# Patient Record
Sex: Female | Born: 1935 | Race: White | Hispanic: No | Smoking: Never smoker
Health system: Southern US, Community
[De-identification: ages and names within clinical notes are randomized; demographics above are authoritative.]

## PROBLEM LIST (undated history)

## (undated) DIAGNOSIS — K769 Liver disease, unspecified: Secondary | ICD-10-CM

## (undated) DIAGNOSIS — E785 Hyperlipidemia, unspecified: Secondary | ICD-10-CM

## (undated) DIAGNOSIS — C50919 Malignant neoplasm of unspecified site of unspecified female breast: Secondary | ICD-10-CM

## (undated) DIAGNOSIS — F419 Anxiety disorder, unspecified: Secondary | ICD-10-CM

## (undated) DIAGNOSIS — IMO0002 Reserved for concepts with insufficient information to code with codable children: Secondary | ICD-10-CM

## (undated) DIAGNOSIS — H04123 Dry eye syndrome of bilateral lacrimal glands: Secondary | ICD-10-CM

## (undated) DIAGNOSIS — F0390 Unspecified dementia without behavioral disturbance: Secondary | ICD-10-CM

## (undated) DIAGNOSIS — K648 Other hemorrhoids: Secondary | ICD-10-CM

## (undated) DIAGNOSIS — I10 Essential (primary) hypertension: Secondary | ICD-10-CM

## (undated) HISTORY — DX: Anxiety disorder, unspecified: F41.9

## (undated) HISTORY — DX: Reserved for concepts with insufficient information to code with codable children: IMO0002

## (undated) HISTORY — DX: Essential (primary) hypertension: I10

## (undated) HISTORY — PX: ABDOMINAL HYSTERECTOMY: SHX81

## (undated) HISTORY — DX: Liver disease, unspecified: K76.9

## (undated) HISTORY — DX: Dry eye syndrome of bilateral lacrimal glands: H04.123

## (undated) HISTORY — PX: MASTECTOMY: SHX3

## (undated) HISTORY — DX: Other hemorrhoids: K64.8

## (undated) HISTORY — PX: CHOLECYSTECTOMY: SHX55

## (undated) HISTORY — DX: Malignant neoplasm of unspecified site of unspecified female breast: C50.919

## (undated) HISTORY — DX: Hyperlipidemia, unspecified: E78.5

---

## 1992-07-25 DIAGNOSIS — C50919 Malignant neoplasm of unspecified site of unspecified female breast: Secondary | ICD-10-CM

## 1992-07-25 HISTORY — DX: Malignant neoplasm of unspecified site of unspecified female breast: C50.919

## 1998-12-03 ENCOUNTER — Other Ambulatory Visit: Admission: RE | Admit: 1998-12-03 | Discharge: 1998-12-03 | Payer: Self-pay | Admitting: Obstetrics and Gynecology

## 2000-10-04 ENCOUNTER — Other Ambulatory Visit: Admission: RE | Admit: 2000-10-04 | Discharge: 2000-10-04 | Payer: Self-pay | Admitting: Obstetrics and Gynecology

## 2004-10-22 ENCOUNTER — Ambulatory Visit: Payer: Self-pay | Admitting: Internal Medicine

## 2004-11-09 ENCOUNTER — Ambulatory Visit: Payer: Self-pay

## 2005-01-28 ENCOUNTER — Ambulatory Visit: Payer: Self-pay | Admitting: Oncology

## 2005-04-26 ENCOUNTER — Ambulatory Visit: Payer: Self-pay | Admitting: Internal Medicine

## 2005-05-13 ENCOUNTER — Ambulatory Visit: Payer: Self-pay | Admitting: Internal Medicine

## 2005-05-27 ENCOUNTER — Ambulatory Visit: Payer: Self-pay | Admitting: Internal Medicine

## 2005-08-15 ENCOUNTER — Ambulatory Visit: Payer: Self-pay | Admitting: Internal Medicine

## 2005-09-13 ENCOUNTER — Ambulatory Visit: Payer: Self-pay | Admitting: Internal Medicine

## 2005-09-30 ENCOUNTER — Ambulatory Visit: Payer: Self-pay | Admitting: Internal Medicine

## 2005-09-30 ENCOUNTER — Ambulatory Visit (HOSPITAL_COMMUNITY): Admission: RE | Admit: 2005-09-30 | Discharge: 2005-09-30 | Payer: Self-pay | Admitting: Internal Medicine

## 2006-01-30 ENCOUNTER — Ambulatory Visit: Payer: Self-pay | Admitting: Oncology

## 2006-02-09 ENCOUNTER — Ambulatory Visit: Payer: Self-pay | Admitting: Internal Medicine

## 2006-02-13 ENCOUNTER — Ambulatory Visit: Payer: Self-pay | Admitting: Internal Medicine

## 2006-03-23 ENCOUNTER — Ambulatory Visit: Payer: Self-pay | Admitting: Internal Medicine

## 2006-04-20 ENCOUNTER — Ambulatory Visit: Payer: Self-pay | Admitting: Internal Medicine

## 2006-05-04 ENCOUNTER — Ambulatory Visit: Payer: Self-pay | Admitting: Internal Medicine

## 2006-08-16 ENCOUNTER — Ambulatory Visit: Payer: Self-pay | Admitting: Internal Medicine

## 2006-08-16 LAB — CONVERTED CEMR LAB
Bilirubin Urine: NEGATIVE
Crystals: NEGATIVE
Ketones, ur: NEGATIVE mg/dL
Specific Gravity, Urine: >=1.03 (ref 1.000–1.03)
Urine Glucose: NEGATIVE mg/dL
Urobilinogen, UA: 0.2 (ref 0.0–1.0)
pH: 5.5 (ref 5.0–8.0)

## 2006-08-18 ENCOUNTER — Ambulatory Visit (HOSPITAL_COMMUNITY): Admission: RE | Admit: 2006-08-18 | Discharge: 2006-08-18 | Payer: Self-pay | Admitting: Internal Medicine

## 2006-11-14 ENCOUNTER — Ambulatory Visit: Payer: Self-pay

## 2006-11-21 ENCOUNTER — Ambulatory Visit: Payer: Self-pay | Admitting: Internal Medicine

## 2007-02-07 ENCOUNTER — Ambulatory Visit: Payer: Self-pay | Admitting: Oncology

## 2007-02-23 ENCOUNTER — Ambulatory Visit: Payer: Self-pay | Admitting: Internal Medicine

## 2007-02-23 DIAGNOSIS — E785 Hyperlipidemia, unspecified: Secondary | ICD-10-CM | POA: Insufficient documentation

## 2007-02-23 DIAGNOSIS — M810 Age-related osteoporosis without current pathological fracture: Secondary | ICD-10-CM | POA: Insufficient documentation

## 2007-02-23 DIAGNOSIS — I6522 Occlusion and stenosis of left carotid artery: Secondary | ICD-10-CM | POA: Insufficient documentation

## 2007-02-23 DIAGNOSIS — F411 Generalized anxiety disorder: Secondary | ICD-10-CM | POA: Insufficient documentation

## 2007-02-23 DIAGNOSIS — R9431 Abnormal electrocardiogram [ECG] [EKG]: Secondary | ICD-10-CM | POA: Insufficient documentation

## 2007-02-23 DIAGNOSIS — Z853 Personal history of malignant neoplasm of breast: Secondary | ICD-10-CM | POA: Insufficient documentation

## 2007-02-23 DIAGNOSIS — I10 Essential (primary) hypertension: Secondary | ICD-10-CM | POA: Insufficient documentation

## 2007-03-01 ENCOUNTER — Encounter: Payer: Self-pay | Admitting: Internal Medicine

## 2007-03-01 LAB — CONVERTED CEMR LAB
ALT: 23 units/L (ref 0–35)
AST: 21 units/L (ref 0–37)
Albumin: 4 g/dL (ref 3.5–5.2)
Bilirubin, Direct: 0.1 mg/dL (ref 0.0–0.3)
HDL: 66.8 mg/dL (ref >39.0)
Total Bilirubin: 0.8 mg/dL (ref 0.3–1.2)
Total CHOL/HDL Ratio: 2.8
Total Protein: 6.9 g/dL (ref 6.0–8.3)
Triglycerides: 118 mg/dL (ref 0–149)
VLDL: 24 mg/dL (ref 0–40)

## 2007-09-12 ENCOUNTER — Ambulatory Visit: Payer: Self-pay | Admitting: Oncology

## 2007-09-14 ENCOUNTER — Encounter: Payer: Self-pay | Admitting: Internal Medicine

## 2007-09-14 LAB — CBC WITH DIFFERENTIAL/PLATELET
EOS%: 5.4 % (ref 0.0–7.0)
Eosinophils Absolute: 0.3 10*3/uL (ref 0.0–0.5)
HGB: 14.4 g/dL (ref 11.6–15.9)
MCHC: 35.6 g/dL (ref 32.0–36.0)
MONO#: 0.7 10*3/uL (ref 0.1–0.9)
MONO%: 12.2 % (ref 0.0–13.0)
NEUT#: 2.6 10*3/uL (ref 1.5–6.5)
NEUT%: 48.4 % (ref 39.6–76.8)
Platelets: 240 10*3/uL (ref 145–400)
lymph#: 1.8 10*3/uL (ref 0.9–3.3)

## 2007-09-14 LAB — COMPREHENSIVE METABOLIC PANEL
Albumin: 4.3 g/dL (ref 3.5–5.2)
CO2: 27 mEq/L (ref 19–32)
Calcium: 9.8 mg/dL (ref 8.4–10.5)
Chloride: 104 mEq/L (ref 96–112)
Glucose, Bld: 108 mg/dL — ABNORMAL HIGH (ref 70–99)

## 2007-09-17 ENCOUNTER — Ambulatory Visit: Payer: Self-pay | Admitting: Internal Medicine

## 2007-09-17 DIAGNOSIS — L570 Actinic keratosis: Secondary | ICD-10-CM | POA: Insufficient documentation

## 2007-11-12 DIAGNOSIS — J40 Bronchitis, not specified as acute or chronic: Secondary | ICD-10-CM | POA: Insufficient documentation

## 2007-11-12 DIAGNOSIS — Z9109 Other allergy status, other than to drugs and biological substances: Secondary | ICD-10-CM | POA: Insufficient documentation

## 2007-11-12 DIAGNOSIS — I739 Peripheral vascular disease, unspecified: Secondary | ICD-10-CM | POA: Insufficient documentation

## 2007-11-13 ENCOUNTER — Ambulatory Visit: Payer: Self-pay | Admitting: Internal Medicine

## 2007-11-16 ENCOUNTER — Ambulatory Visit: Payer: Self-pay

## 2008-03-12 ENCOUNTER — Ambulatory Visit: Payer: Self-pay | Admitting: Internal Medicine

## 2008-03-12 LAB — CONVERTED CEMR LAB
ALT: 17 units/L (ref 0–35)
Albumin: 4.2 g/dL (ref 3.5–5.2)
BUN: 25 mg/dL — ABNORMAL HIGH (ref 6–23)
Chloride: 103 meq/L (ref 96–112)
Creatinine, Ser: 0.7 mg/dL (ref 0.4–1.2)
GFR calc non Af Amer: 86 mL/min
HDL: 60.8 mg/dL (ref >39.0)
Ketones, ur: NEGATIVE mg/dL
LDL Cholesterol: 96 mg/dL (ref 0–99)
Specific Gravity, Urine: 1.02 (ref 1.000–1.03)
TSH: 1.84 microintl units/mL (ref 0.35–5.50)
Total Protein: 6.9 g/dL (ref 6.0–8.3)
Triglycerides: 93 mg/dL (ref 0–149)
Urine Glucose: NEGATIVE mg/dL
VLDL: 19 mg/dL (ref 0–40)
pH: 6 (ref 5.0–8.0)

## 2008-03-17 ENCOUNTER — Ambulatory Visit: Payer: Self-pay | Admitting: Internal Medicine

## 2008-04-21 ENCOUNTER — Encounter: Payer: Self-pay | Admitting: Internal Medicine

## 2008-09-15 ENCOUNTER — Ambulatory Visit: Payer: Self-pay | Admitting: Internal Medicine

## 2008-09-15 DIAGNOSIS — M79609 Pain in unspecified limb: Secondary | ICD-10-CM | POA: Insufficient documentation

## 2008-09-29 ENCOUNTER — Encounter: Payer: Self-pay | Admitting: Internal Medicine

## 2008-11-18 ENCOUNTER — Ambulatory Visit: Payer: Self-pay

## 2008-11-20 ENCOUNTER — Encounter: Payer: Self-pay | Admitting: Internal Medicine

## 2008-12-19 ENCOUNTER — Ambulatory Visit: Payer: Self-pay | Admitting: Internal Medicine

## 2008-12-31 ENCOUNTER — Telehealth: Payer: Self-pay | Admitting: Internal Medicine

## 2009-03-16 ENCOUNTER — Ambulatory Visit: Payer: Self-pay | Admitting: Internal Medicine

## 2009-09-15 ENCOUNTER — Ambulatory Visit: Payer: Self-pay | Admitting: Internal Medicine

## 2009-09-15 DIAGNOSIS — R6883 Chills (without fever): Secondary | ICD-10-CM | POA: Insufficient documentation

## 2009-09-17 LAB — CONVERTED CEMR LAB
ALT: 20 units/L (ref 0–35)
Alkaline Phosphatase: 48 units/L (ref 39–117)
Basophils Absolute: 0 10*3/uL (ref 0.0–0.1)
Creatinine, Ser: 0.7 mg/dL (ref 0.4–1.2)
GFR calc non Af Amer: 85.09 mL/min (ref >60)
Glucose, Bld: 96 mg/dL (ref 70–99)
HCT: 41.8 % (ref 36.0–46.0)
Ketones, ur: NEGATIVE mg/dL
LDL Cholesterol: 61 mg/dL (ref 0–99)
Leukocytes, UA: NEGATIVE
Lymphocytes Relative: 28.4 % (ref 12.0–46.0)
Lymphs Abs: 1.9 10*3/uL (ref 0.7–4.0)
MCHC: 33.4 g/dL (ref 30.0–36.0)
MCV: 88.9 fL (ref 78.0–100.0)
Nitrite: NEGATIVE
Platelets: 189 10*3/uL (ref 150.0–400.0)
RDW: 12.4 % (ref 11.5–14.6)
Sodium: 142 meq/L (ref 135–145)
Specific Gravity, Urine: 1.02 (ref 1.000–1.030)
Total CHOL/HDL Ratio: 2
Total Protein, Urine: NEGATIVE mg/dL
Total Protein: 6.7 g/dL (ref 6.0–8.3)
Triglycerides: 93 mg/dL (ref 0.0–149.0)
Urine Glucose: NEGATIVE mg/dL
Urobilinogen, UA: 0.2 (ref 0.0–1.0)
VLDL: 18.6 mg/dL (ref 0.0–40.0)
WBC: 6.6 10*3/uL (ref 4.5–10.5)
pH: 5 (ref 5.0–8.0)

## 2009-10-09 ENCOUNTER — Encounter: Payer: Self-pay | Admitting: Internal Medicine

## 2009-11-18 ENCOUNTER — Encounter: Payer: Self-pay | Admitting: Internal Medicine

## 2009-11-19 ENCOUNTER — Ambulatory Visit: Payer: Self-pay

## 2009-11-19 ENCOUNTER — Encounter: Payer: Self-pay | Admitting: Internal Medicine

## 2010-03-17 ENCOUNTER — Ambulatory Visit: Payer: Self-pay | Admitting: Internal Medicine

## 2010-03-17 ENCOUNTER — Encounter: Payer: Self-pay | Admitting: Internal Medicine

## 2010-08-22 LAB — CONVERTED CEMR LAB
AST: 21 units/L (ref 0–37)
Alkaline Phosphatase: 47 units/L (ref 39–117)
BUN: 22 mg/dL (ref 6–23)
Basophils Absolute: 0 10*3/uL (ref 0.0–0.1)
Basophils Absolute: 0 10*3/uL (ref 0.0–0.1)
Basophils Relative: 0.5 % (ref 0.0–3.0)
Basophils Relative: 0.7 % (ref 0.0–3.0)
Bilirubin Urine: NEGATIVE
Bilirubin, Direct: 0.1 mg/dL (ref 0.0–0.3)
Bilirubin, Direct: 0.1 mg/dL (ref 0.0–0.3)
CO2: 31 meq/L (ref 19–32)
Calcium: 9.1 mg/dL (ref 8.4–10.5)
Calcium: 9.2 mg/dL (ref 8.4–10.5)
Chloride: 107 meq/L (ref 96–112)
Cholesterol: 186 mg/dL (ref 0–200)
Creatinine, Ser: 0.6 mg/dL (ref 0.4–1.2)
GFR calc non Af Amer: 101.78 mL/min (ref >60)
GFR calc non Af Amer: 94.24 mL/min (ref >60)
Glucose, Bld: 97 mg/dL (ref 70–99)
HCT: 39.7 % (ref 36.0–46.0)
HCT: 42.3 % (ref 36.0–46.0)
HDL: 64.7 mg/dL (ref >39.00)
HDL: 65.3 mg/dL (ref >39.00)
Hemoglobin: 14.7 g/dL (ref 12.0–15.0)
Lymphs Abs: 1.4 10*3/uL (ref 0.7–4.0)
MCHC: 34.8 g/dL (ref 30.0–36.0)
MCV: 88.6 fL (ref 78.0–100.0)
Monocytes Absolute: 0.6 10*3/uL (ref 0.1–1.0)
Monocytes Relative: 12.1 % — ABNORMAL HIGH (ref 3.0–12.0)
Monocytes Relative: 13 % — ABNORMAL HIGH (ref 3.0–12.0)
Neutro Abs: 2.4 10*3/uL (ref 1.4–7.7)
Neutrophils Relative %: 49.8 % (ref 43.0–77.0)
Neutrophils Relative %: 53.4 % (ref 43.0–77.0)
Nitrite: NEGATIVE
Platelets: 212 10*3/uL (ref 150.0–400.0)
Potassium: 4.1 meq/L (ref 3.5–5.1)
RBC: 4.82 M/uL (ref 3.87–5.11)
RDW: 12.4 % (ref 11.5–14.6)
RDW: 13.5 % (ref 11.5–14.6)
Sodium: 143 meq/L (ref 135–145)
Sodium: 143 meq/L (ref 135–145)
Specific Gravity, Urine: 1.025 (ref 1.000–1.030)
TSH: 2.43 microintl units/mL (ref 0.35–5.50)
Total Bilirubin: 0.6 mg/dL (ref 0.3–1.2)
Total Bilirubin: 1 mg/dL (ref 0.3–1.2)
Total CHOL/HDL Ratio: 3
Total Protein, Urine: NEGATIVE mg/dL
Total Protein, Urine: NEGATIVE mg/dL
Triglycerides: 88 mg/dL (ref 0.0–149.0)
Urine Glucose: NEGATIVE mg/dL
Urine Glucose: NEGATIVE mg/dL
Urobilinogen, UA: 0.2 (ref 0.0–1.0)
VLDL: 13.6 mg/dL (ref 0.0–40.0)
VLDL: 17.6 mg/dL (ref 0.0–40.0)
WBC: 4.6 10*3/uL (ref 4.5–10.5)
pH: 5.5 (ref 5.0–8.0)
pH: 6 (ref 5.0–8.0)

## 2010-08-24 NOTE — Assessment & Plan Note (Signed)
Summary: 6 MTH YEARLY---STC   Vital Signs:  Patient profile:   77 year old female Height:      61 inches Weight:      109 pounds BMI:     20.67 O2 Sat:      95 % on Room air Temp:     98.2 degrees F oral Pulse rate:   88 / minute Pulse rhythm:   regular Resp:     16 per minute BP sitting:   120 / 60  (left arm) Cuff size:   regular  Vitals Entered By: Lanier Prude, CMA(AAMA) (March 17, 2010 10:31 AM)  O2 Flow:  Room air CC: MWV Is Patient Diabetic? No   Primary Care Provider:  Plotnikov  CC:  M[facility].  History of Present Illness: The patient presents for a preventive health examination  The patient presents for a follow up of hypertension, OA, hyperlipidem. Patient past medical history, social history, and family history reviewed in detail no significant changes.  Patient is physically active. Depression is negative and mood is good. Hearing is normal, and able to perform activities of daily living. Risk of falling is negligible and home safety has been reviewed and is appropriate. Patient has normal height, weight, and visual acuity. Patient has been counseled on age-appropriate routine health concerns for screening and prevention. Education, counseling done.   Current Medications (verified): 1)  Baby Aspirin 81 Mg  Chew (Aspirin) .... Once Daily 2)  Benadryl 25 Mg  Caps (Diphenhydramine Hcl) .... Q Hs 3)  Lovastatin 20 Mg  Tabs (Lovastatin) .... Once Daily 4)  Ativan 1 Mg  Tabs (Lorazepam) .Marland Kitchen.. 1 Po Two Times A Day As Needed 5)  Vitamin D 1000 Unit Tabs (Cholecalciferol) .... 2 Once Daily 6)  Calcium 500 500 Mg  Tabs (Calcium Carbonate) .... Two Times A Day 7)  Prosthetic Bra .... As Dirr 8)  Claritin 10 Mg Tabs (Loratadine) .Marland Kitchen.. 1 Once Daily  Allergies (verified): 1)  ! * Decongestants  Past History:  Past Medical History: Last updated: 09/17/2007 Anxiety Breast cancer R 1994, hx of Hyperlipidemia Hypertension Osteoporosis  Past Surgical History: Last  updated: 02/23/2007 Cholecystectomy Hysterectomy Mastectomy  Family History: Last updated: 09/17/2007 Family History Hypertension  Social History: Last updated: 09/17/2007 Retired Single widow Never Smoked  Review of Systems  The patient denies fever, dyspnea on exertion, hemoptysis, abdominal pain, anorexia, weight loss, weight gain, vision loss, decreased hearing, hoarseness, chest pain, syncope, peripheral edema, prolonged cough, headaches, melena, hematochezia, severe indigestion/heartburn, hematuria, incontinence, genital sores, muscle weakness, suspicious skin lesions, transient blindness, difficulty walking, depression, unusual weight change, abnormal bleeding, enlarged lymph nodes, angioedema, and breast masses.         OA  Physical Exam  General:  Well-developed, well-nourished ,normal body habitus; no deformities, normal grooming.   Head:  Normocephalic and atraumatic without obvious abnormalities. No apparent alopecia or balding. Eyes:  No corneal or conjunctival inflammation noted. EOMI. Perrla.  Ears:  External ear exam shows no significant lesions or deformities.  Otoscopic examination reveals clear canals, tympanic membranes are intact bilaterally without bulging, retraction, inflammation or discharge. Hearing is grossly normal bilaterally. Nose:  External nasal examination shows no deformity or inflammation. Nasal mucosa are pink and moist without lesions or exudates. Mouth:  Oral mucosa and oropharynx without lesions or exudates.  Teeth in good repair. Neck:  No deformities, masses, or tenderness noted. Chest Wall:  No deformities, masses, or tenderness noted. Lungs:  Normal respiratory effort, chest expands symmetrically. Lungs are  clear to auscultation, no crackles or wheezes. Heart:  Normal rate and regular rhythm. S1 and S2 normal without gallop, murmur, click, rub or other extra sounds. Abdomen:  Bowel sounds positive,abdomen soft and non-tender without masses,  organomegaly or hernias noted. Msk:  No deformity or scoliosis noted of thoracic or lumbar spine.   Pulses:  R and L carotid,radial,femoral,dorsalis pedis and posterior tibial pulses are full and equal bilaterally Extremities:  No clubbing, cyanosis, edema, or deformity noted with normal full range of motion of all joints.   Neurologic:  No cranial nerve deficits noted. Station and gait are normal. Plantar reflexes are down-going bilaterally. DTRs are symmetrical throughout. Sensory, motor and coordinative functions appear intact. Skin:  Intact without suspicious lesions or rashes Cervical Nodes:  No lymphadenopathy noted Inguinal Nodes:  No significant adenopathy Psych:  Cognition and judgment appear intact. Alert and cooperative with normal attention span and concentration. No apparent delusions, illusions, hallucinations   Impression & Recommendations:  Problem # 1:  Preventive Health Care (ICD-V70.0) Assessment New  Overall doing well, age appropriate education and counseling updated and referral for appropriate preventive services done unless declined, immunizations up to date or declined, diet counseling done if overweight, urged to quit smoking if smokes, most recent labs reviewed and current ordered if appropriate, ecg reviewed or declined (interpretation per ECG scanned in the EMR if done); information regarding Medicare Preventation requirements given if appropriate.  Labs ordered Labs Reviewed: SGOT: 22 (09/15/2009)   SGPT: 20 (09/15/2009)   HDL:72.40 (09/15/2009), 65.30 (03/16/2009)  LDL:61 (09/15/2009), 103 (16/04/9603)  Chol:152 (09/15/2009), 186 (03/16/2009)  Trig:93.0 (09/15/2009), 88.0 (03/16/2009)  Problem # 2:  HYPERTENSION (ICD-401.9) Assessment: Unchanged  BP today: 120/60 Prior BP: 122/64 (09/15/2009)  Labs Reviewed: K+: 4.3 (09/15/2009) Creat: : 0.7 (09/15/2009)   Chol: 152 (09/15/2009)   HDL: 72.40 (09/15/2009)   LDL: 61 (09/15/2009)   TG: 93.0  (09/15/2009)  Problem # 3:  ANXIETY (ICD-300.00) Assessment: Improved  Her updated medication list for this problem includes:    Ativan 1 Mg Tabs (Lorazepam) .Marland Kitchen... 1 po two times a day as needed  Problem # 4:  OSTEOPOROSIS (ICD-733.00) Assessment: Unchanged  Vit D  Orders: T-Vitamin D (25-Hydroxy) (54098-11914) T-Bone Densitometry (78295)  Problem # 5:  HYPERLIPIDEMIA (ICD-272.4) Assessment: Unchanged  Her updated medication list for this problem includes:    Lovastatin 20 Mg Tabs (Lovastatin) ..... Once daily  Labs Reviewed: SGOT: 22 (09/15/2009)   SGPT: 20 (09/15/2009)   HDL:72.40 (09/15/2009), 65.30 (03/16/2009)  LDL:61 (09/15/2009), 103 (62/13/0865)  Chol:152 (09/15/2009), 186 (03/16/2009)  Trig:93.0 (09/15/2009), 88.0 (03/16/2009)  Complete Medication List: 1)  Baby Aspirin 81 Mg Chew (Aspirin) .... Once daily 2)  Benadryl 25 Mg Caps (Diphenhydramine hcl) .... Q hs 3)  Lovastatin 20 Mg Tabs (Lovastatin) .... Once daily 4)  Ativan 1 Mg Tabs (Lorazepam) .Marland Kitchen.. 1 po two times a day as needed 5)  Vitamin D 1000 Unit Tabs (Cholecalciferol) .... 2 once daily 6)  Calcium 500 500 Mg Tabs (Calcium carbonate) .... Two times a day 7)  Prosthetic Bra  .... As dirr 8)  Claritin 10 Mg Tabs (Loratadine) .Marland Kitchen.. 1 once daily  Other Orders: EKG w/ Interpretation (93000) Tdap => 37yrs IM (78469) Admin 1st Vaccine (62952) TLB-BMP (Basic Metabolic Panel-BMET) (80048-METABOL) TLB-CBC Platelet - w/Differential (85025-CBCD) TLB-Hepatic/Liver Function Pnl (80076-HEPATIC) TLB-Lipid Panel (80061-LIPID) TLB-TSH (Thyroid Stimulating Hormone) (84443-TSH) TLB-Udip ONLY (81003-UDIP)  Patient Instructions: 1)  Please schedule a follow-up appointment in 6 months. Prescriptions: ATIVAN 1 MG  TABS (LORAZEPAM)  1 po two times a day as needed  #60 x 3   Entered and Authorized by:   Tresa Garter MD   Signed by:   Tresa Garter MD on 03/17/2010   Method used:   Print then Give to Patient    RxID:   [id number]    Immunizations Administered:  Tetanus Vaccine:    Vaccine Type: Tdap    Site: left deltoid    Mfr: GlaxoSmithKline    Dose: 0.25 ml    Route: IM    Given by: Lanier Prude, CMA(AAMA)    Exp. Date: 05/13/2012    Lot #: [id number]    VIS given: 06/12/07 version given March 17, 2010.   Contraindications/Deferment of Procedures/Staging:    Treatment: Flu Shot    Contraindication: other

## 2010-08-24 NOTE — Miscellaneous (Signed)
Summary: Orders Update  Clinical Lists Changes  Orders: Added new Test order of Carotid Duplex (Carotid Duplex) - Signed 

## 2010-08-24 NOTE — Miscellaneous (Signed)
Summary: BONE DENSITY  Clinical Lists Changes  Orders: Added new Test order of T-Lumbar Vertebral Assessment (77082) - Signed 

## 2010-08-24 NOTE — Assessment & Plan Note (Signed)
Summary: 6 MTH FU  $50--STC   Vital Signs:  Patient profile:   77 year old female Weight:      110 pounds Temp:     97.1 degrees F oral Pulse rate:   86 / minute BP sitting:   122 / 64  (left arm)  Vitals Entered By: Tora Perches (September 15, 2009 1:19 PM) CC: f/u Is Patient Diabetic? No   Primary Care Provider:  Plotnikov  CC:  f/u.  History of Present Illness: The patient presents for a follow up of hypertension, , hyperlipidemia, osteoporosis. C/o being cold all the time...   Preventive Screening-Counseling & Management  Alcohol-Tobacco     Smoking Status: never  Allergies: 1)  ! * Decongestants  Past History:  Past Medical History: Last updated: 09/17/2007 Anxiety Breast cancer R 1994, hx of Hyperlipidemia Hypertension Osteoporosis  Past Surgical History: Last updated: 02/23/2007 Cholecystectomy Hysterectomy Mastectomy  Social History: Last updated: 09/17/2007 Retired Single widow Never Smoked  Review of Systems  The patient denies anorexia, fever, chest pain, abdominal pain, and hematochezia.         feeling cold  Physical Exam  General:  Well-developed, well-nourished ,normal body habitus; no deformities, normal grooming.   Nose:  External nasal examination shows no deformity or inflammation. Nasal mucosa are pink and moist without lesions or exudates. Mouth:  Oral mucosa and oropharynx without lesions or exudates.  Teeth in good repair. Lungs:  Normal respiratory effort, chest expands symmetrically. Lungs are clear to auscultation, no crackles or wheezes. Heart:  Normal rate and regular rhythm. S1 and S2 normal without gallop, murmur, click, rub or other extra sounds. Abdomen:  Bowel sounds positive,abdomen soft and non-tender without masses, organomegaly or hernias noted. Msk:  No deformity or scoliosis noted of thoracic or lumbar spine.   Extremities:  No clubbing, cyanosis, edema, or deformity noted with normal full range of motion of  all joints.   Neurologic:  No cranial nerve deficits noted. Station and gait are normal. Plantar reflexes are down-going bilaterally. DTRs are symmetrical throughout. Sensory, motor and coordinative functions appear intact. Skin:  Intact without suspicious lesions or rashes Psych:  Cognition and judgment appear intact. Alert and cooperative with normal attention span and concentration. No apparent delusions, illusions, hallucinations   Impression & Recommendations:  Problem # 1:  CHILLS WITHOUT FEVER (ICD-780.64) Assessment New  Get labs  Orders: TLB-Udip ONLY (81003-UDIP) TLB-TSH (Thyroid Stimulating Hormone) (84443-TSH) TLB-CBC Platelet - w/Differential (85025-CBCD) TLB-BMP (Basic Metabolic Panel-BMET) (80048-METABOL) TLB-Hepatic/Liver Function Pnl (80076-HEPATIC) TLB-Lipid Panel (80061-LIPID)  Problem # 2:  HYPERTENSION (ICD-401.9) Assessment: Improved  BP today: 122/64 Prior BP: 154/84 (03/16/2009)  Labs Reviewed: K+: 3.8 (03/16/2009) Creat: : 0.6 (03/16/2009)   Chol: 186 (03/16/2009)   HDL: 65.30 (03/16/2009)   LDL: 103 (03/16/2009)   TG: 88.0 (03/16/2009)  Problem # 3:  HYPERLIPIDEMIA (ICD-272.4) Assessment: Comment Only  Her updated medication list for this problem includes:    Lovastatin 20 Mg Tabs (Lovastatin) ..... Once daily  Problem # 4:  OSTEOPOROSIS (ICD-733.00) Assessment: Comment Only Vit D  Complete Medication List: 1)  Baby Aspirin 81 Mg Chew (Aspirin) .... Once daily 2)  Benadryl 25 Mg Caps (Diphenhydramine hcl) .... Q hs 3)  Lovastatin 20 Mg Tabs (Lovastatin) .... Once daily 4)  Ativan 1 Mg Tabs (Lorazepam) .Marland Kitchen.. 1 po two times a day as needed 5)  Vitamin D 400 Unit Caps (Cholecalciferol) .... 2 once daily 6)  Calcium 500 500 Mg Tabs (Calcium carbonate) .... Two times a  day 7)  Prosthetic Bra  .... As dirr  Other Orders: Pneumococcal Vaccine (64403) Admin 1st Vaccine (47425) Admin 1st Vaccine [facility]) [phone])  Contraindications/Deferment of  Procedures/Staging:    Test/Procedure: FLU VAX    Reason for deferment: patient declined   Patient Instructions: 1)  Please schedule a follow-up appointment in 6 months well w/labs v70.0. Prescriptions: ATIVAN 1 MG  TABS (LORAZEPAM) 1 po two times a day as needed  #60 x 3   Entered by:   Tora Perches   Authorized by:   Tresa Garter MD   Signed by:   Tora Perches on 09/15/2009   Method used:   Print then Give to Patient   RxID:   [id number] PROSTHETIC BRASS as dirr  #3 x 1   Entered and Authorized by:   Tresa Garter MD   Signed by:   Tresa Garter MD on 09/15/2009   Method used:   Print then Give to Patient   RxID:   [id number]    Pneumovax Vaccine    Vaccine Type: Pneumovax    Site: left deltoid    Mfr: Merck    Dose: 0.5 ml    Route: IM    Given by: Tora Perches    Exp. Date: 11/12/2010    Lot #: [id number]    VIS given: 02/20/96 version given September 15, 2009.

## 2010-09-21 ENCOUNTER — Encounter: Payer: Self-pay | Admitting: Internal Medicine

## 2010-09-21 ENCOUNTER — Ambulatory Visit (INDEPENDENT_AMBULATORY_CARE_PROVIDER_SITE_OTHER): Payer: 59 | Admitting: Internal Medicine

## 2010-09-21 DIAGNOSIS — E785 Hyperlipidemia, unspecified: Secondary | ICD-10-CM

## 2010-09-21 DIAGNOSIS — I10 Essential (primary) hypertension: Secondary | ICD-10-CM

## 2010-09-21 DIAGNOSIS — D485 Neoplasm of uncertain behavior of skin: Secondary | ICD-10-CM

## 2010-09-21 DIAGNOSIS — F411 Generalized anxiety disorder: Secondary | ICD-10-CM

## 2010-09-24 ENCOUNTER — Emergency Department (HOSPITAL_COMMUNITY): Admission: EM | Admit: 2010-09-24 | Payer: 59 | Source: Home / Self Care

## 2010-09-29 ENCOUNTER — Telehealth: Payer: Self-pay | Admitting: Internal Medicine

## 2010-09-30 NOTE — Assessment & Plan Note (Signed)
Summary: 6 MO FU/NWS#   Vital Signs:  Patient profile:   77 year old female Height:      61 inches Weight:      113 pounds BMI:     21.43 Temp:     98.6 degrees F oral Pulse rate:   84 / minute Pulse rhythm:   regular Resp:     16 per minute BP sitting:   140 / 78  Vitals Entered By: Lanier Prude, CMA(AAMA) (September 21, 2010 1:24 PM) CC: 6 mo f/u  Is Patient Diabetic? No   Primary Care Provider:  Plotnikov  CC:  6 mo f/u .  History of Present Illness: The patient presents for a follow up of hypertension, osteoporosis, hyperlipidemia  C/o mole on R forehead  Current Medications (verified): 1)  Baby Aspirin 81 Mg  Chew (Aspirin) .... Once Daily 2)  Benadryl 25 Mg  Caps (Diphenhydramine Hcl) .... Q Hs 3)  Lovastatin 20 Mg  Tabs (Lovastatin) .... Once Daily 4)  Ativan 1 Mg  Tabs (Lorazepam) .Marland Kitchen.. 1 Po Two Times A Day As Needed 5)  Vitamin D 1000 Unit Tabs (Cholecalciferol) .... 2 Once Daily 6)  Calcium 500 500 Mg  Tabs (Calcium Carbonate) .... Two Times A Day 7)  Prosthetic Bra .... As Dirr 8)  Claritin 10 Mg Tabs (Loratadine) .Marland Kitchen.. 1 Once Daily 9)  Systane Preservative Free 0.4-0.3 % Soln (Polyethyl Glycol-Propyl Glycol) .Marland Kitchen.. 1-2 Drops in Affected Eye As Needed  Allergies (verified): 1)  ! * Decongestants  Past History:  Past Surgical History: Last updated: 02/23/2007 Cholecystectomy Hysterectomy Mastectomy  Social History: Last updated: 09/17/2007 Retired Single widow Never Smoked  Past Medical History: Anxiety Breast cancer R 1994, hx of Hyperlipidemia Hypertension Osteoporosis Dry eyes  Review of Systems  The patient denies anorexia, fever, chest pain, dyspnea on exertion, difficulty walking, and depression.         BP ok at home  Physical Exam  General:  Well-developed, well-nourished ,normal body habitus; no deformities, normal grooming.   Ears:  External ear exam shows no significant lesions or deformities.  Otoscopic examination reveals  clear canals, tympanic membranes are intact bilaterally without bulging, retraction, inflammation or discharge. Hearing is grossly normal bilaterally. Nose:  External nasal examination shows no deformity or inflammation. Nasal mucosa are pink and moist without lesions or exudates. Mouth:  Oral mucosa and oropharynx without lesions or exudates.  Teeth in good repair. Neck:  No deformities, masses, or tenderness noted. Lungs:  Normal respiratory effort, chest expands symmetrically. Lungs are clear to auscultation, no crackles or wheezes. Heart:  Normal rate and regular rhythm. S1 and S2 normal without gallop, murmur, click, rub or other extra sounds. Abdomen:  Bowel sounds positive,abdomen soft and non-tender without masses, organomegaly or hernias noted. Msk:  No deformity or scoliosis noted of thoracic or lumbar spine.   Extremities:  No clubbing, cyanosis, edema, or deformity noted with normal full range of motion of all joints.   Neurologic:  No cranial nerve deficits noted. Station and gait are normal. Plantar reflexes are down-going bilaterally. DTRs are symmetrical throughout. Sensory, motor and coordinative functions appear intact. Skin:  large mole on R forehead Cervical Nodes:  No lymphadenopathy noted Psych:  Cognition and judgment appear intact. Alert and cooperative with normal attention span and concentration. No apparent delusions, illusions, hallucinations   Impression & Recommendations:  Problem # 1:  HYPERTENSION (ICD-401.9) Assessment Unchanged  Diet controlled  BP today: 140/78 Prior BP: 120/60 (03/17/2010)  Labs Reviewed:  K+: 4.1 (03/17/2010) Creat: : 0.6 (03/17/2010)   Chol: 172 (03/17/2010)   HDL: 64.70 (03/17/2010)   LDL: 94 (03/17/2010)   TG: 68.0 (03/17/2010)  Problem # 2:  CAROTID ARTERY STENOSIS, BILATERAL (ICD-433.10) Assessment: Unchanged  Her updated medication list for this problem includes:    Baby Aspirin 81 Mg Chew (Aspirin) ..... Once  daily  Problem # 3:  ANXIETY (ICD-300.00) Assessment: Unchanged  Her updated medication list for this problem includes:    Ativan 1 Mg Tabs (Lorazepam) .Marland Kitchen... 1 po two times a day as needed  Problem # 4:  OSTEOPOROSIS (ICD-733.00) Assessment: Unchanged vit D  Problem # 5:  NEOPLASM OF UNCERTAIN BEHAVIOR OF SKIN (ICD-238.2), forehead Assessment: Deteriorated will biopsy   Complete Medication List: 1)  Baby Aspirin 81 Mg Chew (Aspirin) .... Once daily 2)  Benadryl 25 Mg Caps (Diphenhydramine hcl) .... Q hs 3)  Lovastatin 20 Mg Tabs (Lovastatin) .... Once daily 4)  Ativan 1 Mg Tabs (Lorazepam) .Marland Kitchen.. 1 po two times a day as needed 5)  Vitamin D 1000 Unit Tabs (Cholecalciferol) .... 2 once daily 6)  Calcium 500 500 Mg Tabs (Calcium carbonate) .... Two times a day 7)  Prosthetic Bra  .... As dirr 8)  Claritin 10 Mg Tabs (Loratadine) .Marland Kitchen.. 1 once daily 9)  Systane Preservative Free 0.4-0.3 % Soln (Polyethyl glycol-propyl glycol) .Marland Kitchen.. 1-2 drops in affected eye as needed  Patient Instructions: 1)  Please schedule a follow-up appointment in 6 months well w/labs. 2)  Skin biopsy w/me  Contraindications/Deferment of Procedures/Staging:    Test/Procedure: FLU VAX    Reason for deferment: patient declined  Prescriptions: LOVASTATIN 20 MG  TABS (LOVASTATIN) once daily  #30 Each x 11   Entered and Authorized by:   Tresa Garter MD   Signed by:   Tresa Garter MD on 09/21/2010   Method used:   Print then Give to Patient   RxID:   [id number] ATIVAN 1 MG  TABS (LORAZEPAM) 1 po two times a day as needed  #60 x 3   Entered and Authorized by:   Tresa Garter MD   Signed by:   Tresa Garter MD on 09/21/2010   Method used:   Print then Give to Patient   RxID:   [id number]    Orders Added: 1)  Est. Patient Level IV [29518]

## 2010-10-05 NOTE — Progress Notes (Addendum)
  Phone Note Call from Patient   Summary of Call: Pt has had blood clots in her urine x 3 this am.  Initial call taken by: Lamar Sprinkles, CMA,  September 29, 2010 11:31 AM  Follow-up for Phone Call        No apts avail, pt is willing to go to UC now for eval.  Follow-up by: Lamar Sprinkles, CMA,  September 29, 2010 11:35 AM     Appended Document:  Pt states that she went to Urgent Care where she was treated for Bladder Infection w/ABX x7 days and was told that u/a was being sent for culture. Pt has not heard anything back concerning test results and wanted to know if our office has bent sent results.?

## 2010-10-11 ENCOUNTER — Telehealth: Payer: Self-pay | Admitting: Internal Medicine

## 2010-10-12 ENCOUNTER — Telehealth: Payer: Self-pay | Admitting: Internal Medicine

## 2010-10-12 NOTE — Telephone Encounter (Signed)
Pt states that she has authorized [facility] to release her records to our office from 09/29/10 visit for bladder infection and U/A culture results. Called UC and requested records from [place] via fax/Received. No Growth on culture reasults. Per VO Dr Posey Rea, informed Pt.

## 2010-10-21 NOTE — Progress Notes (Signed)
Summary: u/a results  Phone Note Call from Patient Call back at Home Phone [phone]   Caller: Patient Reason for Call: Lab or Test Results Summary of Call: Pt states that she went to Urgent Care where she was treated for Bladder Infection w/ABX x7 days and was told that u/a was being sent for culture. Pt has not heard anything back concerning test results and wanted to know if our office has bent sent results.? Initial call taken by: Burnard Leigh [facility]),  October 11, 2010 12:32 PM  Follow-up for Phone Call        I'm not sure Follow-up by: Tresa Garter MD,  October 11, 2010 6:38 PM  Additional Follow-up for Phone Call Additional follow up Details #1::        informed pt that she needs to call UC and auth. them to sent records to MD Additional Follow-up by: Ami Bullins CMA,  October 11, 2010 7:09 PM

## 2010-10-29 ENCOUNTER — Telehealth: Payer: Self-pay | Admitting: Internal Medicine

## 2010-11-02 NOTE — Telephone Encounter (Signed)
Entry is in error

## 2010-11-15 ENCOUNTER — Telehealth: Payer: Self-pay | Admitting: *Deleted

## 2010-11-15 DIAGNOSIS — N3000 Acute cystitis without hematuria: Secondary | ICD-10-CM

## 2010-11-15 NOTE — Telephone Encounter (Signed)
Pt left vm - feels she has a UTI and req rx for abx. Rx? Or u/a?

## 2010-11-16 NOTE — Telephone Encounter (Signed)
Per MD ok for u/a. Pt aware but she feels better today. Will put order in if symptoms return.

## 2010-12-06 ENCOUNTER — Encounter: Payer: Self-pay | Admitting: Internal Medicine

## 2010-12-07 ENCOUNTER — Ambulatory Visit (INDEPENDENT_AMBULATORY_CARE_PROVIDER_SITE_OTHER): Payer: 59 | Admitting: Internal Medicine

## 2010-12-07 ENCOUNTER — Encounter: Payer: Self-pay | Admitting: Internal Medicine

## 2010-12-07 VITALS — BP 92/52 | HR 80 | Temp 98.4°F | Resp 16 | Wt 112.0 lb

## 2010-12-07 DIAGNOSIS — D499 Neoplasm of unspecified behavior of unspecified site: Secondary | ICD-10-CM

## 2010-12-07 NOTE — Assessment & Plan Note (Signed)
R forehead mole   Procedure Note :     Procedure :  Skin biopsy   Indication:  Changing mole (s ),  Suspicious lesion(s)   Risks including unsuccessful procedure , bleeding, infection, bruising, scar, a need for another complete procedure and others were explained to the patient in detail as well as the benefits. Informed consent was obtained and signed.   The patient was placed in a decubitus position.  Lesion #1 on  R forehead    measuring  21x11   mm   Skin over lesion #1  was prepped with Betadine and alcohol  and anesthetized with 1 cc of 2% lidocaine and epinephrine, using a 25-gauge 1 inch needle.  Shave biopsy with a sterile Dermablade was carried out in several fragments, in the usual fashion.   Hyfrecator was used to destroy the rest of the lesion  left behind and for hemostasis. Band-Aid was applied with antibiotic ointment.    Lesion #2 on     measuring   mm   Skin over lesion #2  was prepped with Betadine and alcohol  and anesthetized with 1 cc of 2% lidocaine and epinephrine, using a 25-gauge 1 inch needle.  Shave biopsy with a sterile Dermablade was carried out in the usual fashion. It was attempted to biopsy with  1 mm free margins.  Hyfrecator was used to destroy the rest of the lesion potentially left behind and for hemostasis. Band-Aid was applied with antibiotic ointment.  Lesion #3 on    measuring   mm   Skin over lesion #3  was prepped with Betadine and alcohol  and anesthetized with 1 cc of 2% lidocaine and epinephrine, using a 25-gauge 1 inch needle.  Shave biopsy with a sterile Dermablade was carried out in the usual fashion. It was attempted to biopsy with  1 mm free margins.  Hyfrecator was used to destroy the rest of the lesion potentially left behind and for hemostasis. Band-Aid was applied with antibiotic ointment.   Tolerated well. Complications none.      Postprocedure instructions :    A Band-Aid should be  changed twice daily. You can take a shower  tomorrow.  Keep the wounds clean. You can wash them with liquid soap and water. Pat dry with gauze or a Kleenex tissue  Before applying antibiotic ointment and a Band-Aid.   You need to report immediately  if fever, chills or any signs of infection develop.    The biopsy results should be available in 1 -2 weeks.

## 2010-12-07 NOTE — Patient Instructions (Signed)
Postprocedure instructions :    A Band-Aid should be  changed twice daily. You can take a shower tomorrow.  Keep the wounds clean. You can wash them with liquid soap and water. Pat dry with gauze or a Kleenex tissue  Before applying antibiotic ointment and a Band-Aid.   You need to report immediately  if fever, chills or any signs of infection develop.    The biopsy results should be available in 1 -2 weeks. 

## 2010-12-10 NOTE — Assessment & Plan Note (Signed)
Regina Williams                           GASTROENTEROLOGY OFFICE NOTE   Regina Williams, Regina Williams                        MRN:          [id number]  DATE:03/23/2006                            DOB:          [birth year]    Regina Williams is a 77 year old white female with history of stage II node  negative breast CA, whom we have seen in the past for colorectal screening  colonoscopy in January 2003, showed internal hemorrhoids, no evidence of  polyps.  She at that time complained of hematochezia, which was attributed  to anal sores.  She has recently had a change in her bowel habits to  somewhat constipation, with some rectal bleeding and irritation.  She also  has a symptomatic cystocele resulting in leakage of urine and perineal  irritation.   MEDICATIONS:  1. Calcium supplements.  2. Fosamax 70 mg weekly.  3. Baby aspirin 81 mg daily.  4. Fish oil.   PAST MEDICAL HISTORY:  1. Hyperlipidemia.  2. Breast cancer in 1994.  3. Allergies.  4. Hysterectomy in 1995.  5. Cholecystectomy.   FAMILY HISTORY:  Negative for colon cancer.  Mother had heart disease and  heart attacks.   SOCIAL HISTORY:  She is widowed, no children.  She is retired from [employer].  The patient does not smoke and does not drink alcohol.   REVIEW OF SYSTEMS:  Positive for itching and perineal irritation.   PHYSICAL EXAMINATION:  Blood pressure 130/78, pulse of 96, and a weight of  121 pounds.  She was alert, oriented, in no distress.  Sclerae is  nonicteric.  NECK:  Supple.  LUNGS:  Clear to auscultation.  COR:  Normal S1, normal S2.  ABDOMEN:  Soft and nontender with normoactive bowel sounds, no bruit.  RECTAL:  Exam shows intense erythema in the perianal area extending into the  paravaginal and vaginal area.  This appears to be almost as a scalded skin.  There is no yeast infection, and it is not as tender as it appears.  Anorectal tone itself is normal.  Stools are trace hemoccult  positive.  There was no pain on digital exam.   IMPRESSION:  A 77 year old white female with change in the bowel habits with  constipation, previously essentially negative colonoscopy almost 5 years  ago.  She is now heme-positive, and this could be hopefully just due to the  perineal irritation, due to cystocele leakage and due to some straining, but  because of her history of breast CA and positive Hemoccult test, I think she  ought to have a colonoscopy which would be almost within 5 yeas of the last  exam.  She has done very well with her breast CA.   PLAN:  1. We have discussed constipation with the patient.  She will continue her      walking program, which she started in the mornings, which is quite good      for her bowel function.  2. Continue high fiber diet, especially vegetables and fruits on a daily      basis.  3. Stool softener over-the-counter  on a daily basis or 3 times a week as      needed.  4. Samples of Calmoseptine solution to be used 2 or 3 times a day on the      perineum and rectum for irritation.  5. Colonoscopy has been scheduled, using the routine colonoscopy prep.      Depending on the results, she may need a specific treatment for      hemorrhoids.                                   Hedwig Morton. Juanda Chance, MD   DMB/MedQ  DD:  03/23/2006  DT:  03/24/2006  Job #:  [id number]   cc:   Genene Churn. Cyndie Chime, MD  Malachi Pro. Ambrose Mantle, MD

## 2010-12-12 ENCOUNTER — Telehealth: Payer: Self-pay | Admitting: Internal Medicine

## 2010-12-12 NOTE — Progress Notes (Signed)
  Subjective:    Patient ID: Regina Williams, female    DOB: 1924-11-26, 77 y.o.   MRN: [id number]  HPI  Skin Bx  Review of Systems     Objective:   Physical Exam        Assessment & Plan:  NEOPLASM OF UNCERTAIN BEHAVIOR OF SKIN  R forehead mole   Procedure Note :     Procedure :  Skin biopsy   Indication:  Changing mole (s ),  Suspicious lesion(s)   Risks including unsuccessful procedure , bleeding, infection, bruising, scar, a need for another complete procedure and others were explained to the patient in detail as well as the benefits. Informed consent was obtained and signed.   The patient was placed in a decubitus position.  Lesion #1 on  R forehead    measuring  21x11   mm   Skin over lesion #1  was prepped with Betadine and alcohol  and anesthetized with 1 cc of 2% lidocaine and epinephrine, using a 25-gauge 1 inch needle.  Shave biopsy with a sterile Dermablade was carried out in several fragments, in the usual fashion.   Hyfrecator was used to destroy the rest of the lesion  left behind and for hemostasis. Band-Aid was applied with antibiotic ointment.    Lesion #2 on     measuring   mm   Skin over lesion #2  was prepped with Betadine and alcohol  and anesthetized with 1 cc of 2% lidocaine and epinephrine, using a 25-gauge 1 inch needle.  Shave biopsy with a sterile Dermablade was carried out in the usual fashion. It was attempted to biopsy with  1 mm free margins.  Hyfrecator was used to destroy the rest of the lesion potentially left behind and for hemostasis. Band-Aid was applied with antibiotic ointment.  Lesion #3 on    measuring   mm   Skin over lesion #3  was prepped with Betadine and alcohol  and anesthetized with 1 cc of 2% lidocaine and epinephrine, using a 25-gauge 1 inch needle.  Shave biopsy with a sterile Dermablade was carried out in the usual fashion. It was attempted to biopsy with  1 mm free margins.  Hyfrecator was used to destroy the rest of the  lesion potentially left behind and for hemostasis. Band-Aid was applied with antibiotic ointment.   Tolerated well. Complications none.      Postprocedure instructions :    A Band-Aid should be  changed twice daily. You can take a shower tomorrow.  Keep the wounds clean. You can wash them with liquid soap and water. Pat dry with gauze or a Kleenex tissue  Before applying antibiotic ointment and a Band-Aid.   You need to report immediately  if fever, chills or any signs of infection develop.    The biopsy results should be available in 1 -2 weeks.

## 2010-12-12 NOTE — Telephone Encounter (Signed)
Regina Williams, please, inform patient that bx is normal Thx

## 2010-12-14 NOTE — Telephone Encounter (Signed)
Left detailed mess informing pt of below.  

## 2011-03-22 ENCOUNTER — Ambulatory Visit (INDEPENDENT_AMBULATORY_CARE_PROVIDER_SITE_OTHER): Payer: 59 | Admitting: Internal Medicine

## 2011-03-22 ENCOUNTER — Encounter: Payer: Self-pay | Admitting: Internal Medicine

## 2011-03-22 ENCOUNTER — Other Ambulatory Visit (INDEPENDENT_AMBULATORY_CARE_PROVIDER_SITE_OTHER): Payer: 59

## 2011-03-22 VITALS — BP 130/70 | HR 68 | Temp 97.9°F | Resp 16 | Wt 112.0 lb

## 2011-03-22 DIAGNOSIS — L570 Actinic keratosis: Secondary | ICD-10-CM

## 2011-03-22 DIAGNOSIS — Z0000 Encounter for general adult medical examination without abnormal findings: Secondary | ICD-10-CM

## 2011-03-22 DIAGNOSIS — Z136 Encounter for screening for cardiovascular disorders: Secondary | ICD-10-CM

## 2011-03-22 DIAGNOSIS — N3000 Acute cystitis without hematuria: Secondary | ICD-10-CM

## 2011-03-22 LAB — URINALYSIS, ROUTINE W REFLEX MICROSCOPIC
Bilirubin Urine: NEGATIVE
Nitrite: NEGATIVE
Specific Gravity, Urine: >=1.03 (ref 1.000–1.030)
Total Protein, Urine: NEGATIVE
Urobilinogen, UA: 0.2 (ref 0.0–1.0)

## 2011-03-22 LAB — COMPREHENSIVE METABOLIC PANEL
ALT: 21 U/L (ref 0–35)
Alkaline Phosphatase: 47 U/L (ref 39–117)
BUN: 27 mg/dL — ABNORMAL HIGH (ref 6–23)
CO2: 30 mEq/L (ref 19–32)
Potassium: 3.9 mEq/L (ref 3.5–5.1)

## 2011-03-22 LAB — CBC WITH DIFFERENTIAL/PLATELET
Basophils Absolute: 0 10*3/uL (ref 0.0–0.1)
Basophils Relative: 0.5 % (ref 0.0–3.0)
Eosinophils Relative: 4.3 % (ref 0.0–5.0)
HCT: 42.5 % (ref 36.0–46.0)
Hemoglobin: 14.4 g/dL (ref 12.0–15.0)
Lymphocytes Relative: 28.5 % (ref 12.0–46.0)
Lymphs Abs: 1.5 10*3/uL (ref 0.7–4.0)
Monocytes Relative: 13 % — ABNORMAL HIGH (ref 3.0–12.0)
Neutro Abs: 2.7 10*3/uL (ref 1.4–7.7)
RBC: 4.78 Mil/uL (ref 3.87–5.11)
RDW: 12.9 % (ref 11.5–14.6)

## 2011-03-22 LAB — LIPID PANEL
LDL Cholesterol: 81 mg/dL (ref 0–99)
Total CHOL/HDL Ratio: 2
Triglycerides: 55 mg/dL (ref 0.0–149.0)

## 2011-03-22 NOTE — Patient Instructions (Signed)
Postprocedure instructions :     Keep the wound clean. You can wash them with liquid soap and water. Pat dry with gauze or a Kleenex tissue  Before applying antibiotic ointment and a Band-Aid.   You need to report immediately  if  any signs of infection develop.   

## 2011-03-22 NOTE — Progress Notes (Signed)
Subjective:    Patient ID: Regina Williams, female    DOB: 04-15-1925, 77 y.o.   MRN: [id number]  HPI  The patient is here for a wellness exam. The patient has been doing well overall without major physical or psychological issues going on lately.  Review of Systems  Constitutional: Negative for fever, chills, diaphoresis, activity change, appetite change, fatigue and unexpected weight change.  HENT: Negative for hearing loss, ear pain, congestion, sore throat, sneezing, mouth sores, neck pain, dental problem, voice change, postnasal drip and sinus pressure.   Eyes: Negative for pain and visual disturbance.  Respiratory: Negative for cough, chest tightness, wheezing and stridor.   Cardiovascular: Negative for chest pain, palpitations and leg swelling.  Gastrointestinal: Negative for nausea, vomiting, abdominal pain, blood in stool, abdominal distention and rectal pain.  Genitourinary: Negative for dysuria, hematuria, decreased urine volume, vaginal bleeding, vaginal discharge, difficulty urinating, vaginal pain and menstrual problem.  Musculoskeletal: Negative for back pain, joint swelling and gait problem.  Skin: Negative for color change, rash and wound.  Neurological: Negative for dizziness, tremors, syncope, speech difficulty and light-headedness.  Hematological: Negative for adenopathy.  Psychiatric/Behavioral: Positive for sleep disturbance (sometimes) and decreased concentration. Negative for suicidal ideas, hallucinations, behavioral problems, confusion and dysphoric mood. The patient is not hyperactive.        Objective:   Physical Exam  Constitutional: She appears well-developed and well-nourished. No distress.  HENT:  Head: Normocephalic.  Right Ear: External ear normal.  Left Ear: External ear normal.  Nose: Nose normal.  Mouth/Throat: Oropharynx is clear and moist.  Eyes: Conjunctivae are normal. Pupils are equal, round, and reactive to light. Right eye exhibits no  discharge. Left eye exhibits no discharge.  Neck: Normal range of motion. Neck supple. No JVD present. No tracheal deviation present. No thyromegaly present.  Cardiovascular: Normal rate, regular rhythm and normal heart sounds.   Pulmonary/Chest: No stridor. No respiratory distress. She has no wheezes.  Abdominal: Soft. Bowel sounds are normal. She exhibits no distension and no mass. There is no tenderness. There is no rebound and no guarding.  Musculoskeletal: She exhibits no edema and no tenderness.  Lymphadenopathy:    She has no cervical adenopathy.  Neurological: She displays normal reflexes. No cranial nerve deficit. She exhibits normal muscle tone. Coordination normal.  Skin: No rash noted. No erythema.       AK on R middle finger  Psychiatric: She has a normal mood and affect. Her behavior is normal. Judgment and thought content normal.     Procedure Note :     Procedure : Cryosurgery   Indication:  R middle finger  Actinic keratosis(es)   Risks including unsuccessful procedure , bleeding, infection, bruising, scar, a need for a repeat  procedure and others were explained to the patient in detail as well as the benefits. Informed consent was obtained verbally.   1  lesion(s)  on  R 3d finger  was/were treated with liquid nitrogen on a Q-tip in a usual fasion . Band-Aid was applied and antibiotic ointment was given for a later use.   Tolerated well. Complications none.   Postprocedure instructions :     Keep the wounds clean. You can wash them with liquid soap and water. Pat dry with gauze or a Kleenex tissue  Before applying antibiotic ointment and a Band-Aid.   You need to report immediately  if  any signs of infection develop.         Assessment & Plan:

## 2011-03-22 NOTE — Assessment & Plan Note (Signed)
See above

## 2011-03-23 ENCOUNTER — Telehealth: Payer: Self-pay | Admitting: Internal Medicine

## 2011-03-23 NOTE — Telephone Encounter (Signed)
Pt informed

## 2011-03-23 NOTE — Telephone Encounter (Signed)
Stacey, please, inform patient that all labs are OK Thx   

## 2011-03-31 ENCOUNTER — Encounter: Payer: Self-pay | Admitting: Internal Medicine

## 2011-05-16 ENCOUNTER — Telehealth: Payer: Self-pay | Admitting: *Deleted

## 2011-05-16 ENCOUNTER — Other Ambulatory Visit: Payer: Self-pay | Admitting: Internal Medicine

## 2011-05-16 NOTE — Telephone Encounter (Signed)
Requested Medications     LORazepam (ATIVAN) 1 MG tablet [Pharmacy Med Name: LORAZEPAM 1MG  TAB]   TAKE ONE TABLET BY MOUTH TWICE DAILY AS NEEDED   Disp: 60 each R: 2 Start: 05/16/2011

## 2011-05-16 NOTE — Telephone Encounter (Signed)
OK to fill this prescription with additional refills x3 Thank you!  

## 2011-05-17 MED ORDER — LORAZEPAM 1 MG PO TABS: 1.0000 mg | ORAL_TABLET | Freq: Two times a day (BID) | ORAL | Status: DC | PRN

## 2011-07-01 ENCOUNTER — Ambulatory Visit (INDEPENDENT_AMBULATORY_CARE_PROVIDER_SITE_OTHER): Payer: 59 | Admitting: Internal Medicine

## 2011-07-01 ENCOUNTER — Encounter: Payer: Self-pay | Admitting: Internal Medicine

## 2011-07-01 VITALS — BP 128/62 | HR 100 | Temp 97.9°F

## 2011-07-01 DIAGNOSIS — N390 Urinary tract infection, site not specified: Secondary | ICD-10-CM

## 2011-07-01 MED ORDER — CIPROFLOXACIN HCL 250 MG PO TABS: 250.0000 mg | ORAL_TABLET | Freq: Two times a day (BID) | ORAL | Status: AC

## 2011-07-01 NOTE — Patient Instructions (Signed)
It was good to see you today. Cipro antibiotics twice a day for one week - Your prescription(s) have been submitted to your pharmacy. Please take as directed and contact our office if you believe you are having problem(s) with the medication(s). Keep hydrated and use Tylenol as needed for discomfort Call if symptoms unimproved or worse in the next 2-3 days to recheck urine as discussed if needed

## 2011-07-01 NOTE — Progress Notes (Signed)
HPI: complains of UTI symptoms Onset 4 days ago, progressively worse associated with dysuria and small volume voiding with increased frequency denies hematuria, flank pain or fever The patient has a history of prior UTI  PMH: reviewed  ROS:  Gen.: No unexpected weight change, no night sweats Lungs: No cough or shortness of breath Cardiovascular: No palpitations or chest pain  PE: BP 128/62  Pulse 100  Temp(Src) 97.9 F (36.6 C) (Oral)  SpO2 96% General: No acute distress Lungs: Clear to auscultation Cardiovascular: Regular rate rhythm, no edema Abdomen: Mild to moderate discomfort of her suprapubic region, no flank tenderness to palpation  Lab Results  Component Value Date   WBC 5.1 03/22/2011   HGB 14.4 03/22/2011   HCT 42.5 03/22/2011   PLT 208.0 03/22/2011   GLUCOSE 96 03/22/2011   CHOL 165 03/22/2011   TRIG 55.0 03/22/2011   HDL 73.00 03/22/2011   LDLCALC 81 03/22/2011   ALT 21 03/22/2011   AST 21 03/22/2011   NA 142 03/22/2011   K 3.9 03/22/2011   CL 104 03/22/2011   CREATININE 0.6 03/22/2011   BUN 27* 03/22/2011   CO2 30 03/22/2011   TSH 1.64 03/22/2011   Udip: Unable to provide sample today  Assessment/Plan: UTI, classic symptoms with history of same  Empiric antibiotic x7 days Urine culture for identification and sensitivities Hydration recommended education provided

## 2011-07-26 DIAGNOSIS — K769 Liver disease, unspecified: Secondary | ICD-10-CM

## 2011-07-26 HISTORY — DX: Liver disease, unspecified: K76.9

## 2011-08-25 ENCOUNTER — Other Ambulatory Visit: Payer: Self-pay | Admitting: *Deleted

## 2011-08-25 MED ORDER — LOVASTATIN 20 MG PO TABS: 20.0000 mg | ORAL_TABLET | Freq: Every day | ORAL | Status: DC

## 2011-09-06 ENCOUNTER — Encounter: Payer: Self-pay | Admitting: Internal Medicine

## 2011-09-27 ENCOUNTER — Ambulatory Visit: Payer: 59 | Admitting: Internal Medicine

## 2011-10-03 ENCOUNTER — Encounter: Payer: Self-pay | Admitting: Internal Medicine

## 2011-10-03 ENCOUNTER — Ambulatory Visit (INDEPENDENT_AMBULATORY_CARE_PROVIDER_SITE_OTHER): Payer: 59 | Admitting: Internal Medicine

## 2011-10-03 DIAGNOSIS — I739 Peripheral vascular disease, unspecified: Secondary | ICD-10-CM

## 2011-10-03 DIAGNOSIS — E785 Hyperlipidemia, unspecified: Secondary | ICD-10-CM

## 2011-10-03 DIAGNOSIS — L570 Actinic keratosis: Secondary | ICD-10-CM

## 2011-10-03 DIAGNOSIS — I10 Essential (primary) hypertension: Secondary | ICD-10-CM

## 2011-10-03 NOTE — Progress Notes (Signed)
Patient ID: Regina Williams, female   DOB: 1925/07/23, 78 y.o.   MRN: [id number]  Subjective:    Patient ID: Regina Williams, female    DOB: September 03, 1924, 78 y.o.   MRN: [id number]  HPI  F/u PVD, anxiety, osteoporosis  Review of Systems  Constitutional: Negative for fever, chills, diaphoresis, activity change, appetite change, fatigue and unexpected weight change.  HENT: Negative for hearing loss, ear pain, congestion, sore throat, sneezing, mouth sores, neck pain, dental problem, voice change, postnasal drip and sinus pressure.   Eyes: Negative for pain and visual disturbance.  Respiratory: Negative for cough, chest tightness, wheezing and stridor.   Cardiovascular: Negative for chest pain, palpitations and leg swelling.  Gastrointestinal: Negative for nausea, vomiting, abdominal pain, blood in stool, abdominal distention and rectal pain.  Genitourinary: Negative for dysuria, hematuria, decreased urine volume, vaginal bleeding, vaginal discharge, difficulty urinating, vaginal pain and menstrual problem.  Musculoskeletal: Negative for back pain, joint swelling and gait problem.  Skin: Negative for color change, rash and wound.  Neurological: Negative for dizziness, tremors, syncope, speech difficulty and light-headedness.  Hematological: Negative for adenopathy.  Psychiatric/Behavioral: Positive for sleep disturbance (sometimes) and decreased concentration. Negative for suicidal ideas, hallucinations, behavioral problems, confusion and dysphoric mood. The patient is not hyperactive.        Objective:   Physical Exam  Constitutional: She appears well-developed and well-nourished. No distress.  HENT:  Head: Normocephalic.  Right Ear: External ear normal.  Left Ear: External ear normal.  Nose: Nose normal.  Mouth/Throat: Oropharynx is clear and moist.  Eyes: Conjunctivae are normal. Pupils are equal, round, and reactive to light. Right eye exhibits no discharge. Left eye exhibits no discharge.    Neck: Normal range of motion. Neck supple. No JVD present. No tracheal deviation present. No thyromegaly present.  Cardiovascular: Normal rate, regular rhythm and normal heart sounds.   Pulmonary/Chest: No stridor. No respiratory distress. She has no wheezes.  Abdominal: Soft. Bowel sounds are normal. She exhibits no distension and no mass. There is no tenderness. There is no rebound and no guarding.  Musculoskeletal: She exhibits no edema and no tenderness.  Lymphadenopathy:    She has no cervical adenopathy.  Neurological: She displays normal reflexes. No cranial nerve deficit. She exhibits normal muscle tone. Coordination normal.  Skin: No rash noted. No erythema.       AK x2 on nose tip and x1 on forehead R  Psychiatric: She has a normal mood and affect. Her behavior is normal. Judgment and thought content normal.     Procedure Note :     Procedure : Cryosurgery   Indication:    Actinic keratosis(es) x3    Risks including unsuccessful procedure , bleeding, infection, bruising, scar, a need for a repeat  procedure and others were explained to the patient in detail as well as the benefits. Informed consent was obtained verbally.    2 lesion(s)  on the nose tip and 1 on R forehead  was/were treated with liquid nitrogen on a Q-tip in a usual fasion .   Tolerated well. Complications none.   Postprocedure instructions :     Keep the wounds clean. You can wash them with liquid soap and water. Pat dry with gauze or a Kleenex tissue  Before applying antibiotic ointment and a Band-Aid.   You need to report immediately  if  any signs of infection develop.         Assessment & Plan:

## 2011-10-03 NOTE — Assessment & Plan Note (Signed)
Continue with current prescription therapy as reflected on the Med list.  

## 2011-10-03 NOTE — Patient Instructions (Signed)
Postprocedure instructions :     Keep the wounds clean. You can wash them with liquid soap and water. Pat dry with gauze or a Kleenex tissue  before applying antibiotic ointment    You need to report immediately  if  any signs of infection develop.   

## 2011-10-03 NOTE — Assessment & Plan Note (Signed)
See procedure note.

## 2011-11-22 ENCOUNTER — Telehealth: Payer: Self-pay

## 2011-11-22 NOTE — Telephone Encounter (Signed)
Pt called c/o of LT hip pain.  Pt thinks she may have "over done it" digging in her yard. Pt is requesting Rx to treat, please advise.

## 2011-11-22 NOTE — Telephone Encounter (Signed)
Use Advil 200 mg 2 tabs bid pc x 2-4 d Ice OV if not better Thx

## 2011-11-23 NOTE — Telephone Encounter (Signed)
Pt advised and agreed to MD's recommendations.

## 2011-11-29 ENCOUNTER — Other Ambulatory Visit: Payer: Self-pay | Admitting: *Deleted

## 2011-11-29 DIAGNOSIS — I6529 Occlusion and stenosis of unspecified carotid artery: Secondary | ICD-10-CM

## 2011-12-02 ENCOUNTER — Encounter (INDEPENDENT_AMBULATORY_CARE_PROVIDER_SITE_OTHER): Payer: 59

## 2011-12-02 DIAGNOSIS — I6529 Occlusion and stenosis of unspecified carotid artery: Secondary | ICD-10-CM

## 2012-01-05 ENCOUNTER — Other Ambulatory Visit: Payer: Self-pay | Admitting: *Deleted

## 2012-01-05 MED ORDER — LOVASTATIN 20 MG PO TABS: 20.0000 mg | ORAL_TABLET | Freq: Every day | ORAL | Status: DC

## 2012-02-17 ENCOUNTER — Ambulatory Visit (INDEPENDENT_AMBULATORY_CARE_PROVIDER_SITE_OTHER): Payer: 59 | Admitting: Internal Medicine

## 2012-02-17 ENCOUNTER — Encounter: Payer: Self-pay | Admitting: Internal Medicine

## 2012-02-17 VITALS — BP 122/72 | HR 93 | Temp 97.4°F | Wt 113.0 lb

## 2012-02-17 DIAGNOSIS — N950 Postmenopausal bleeding: Secondary | ICD-10-CM

## 2012-02-17 DIAGNOSIS — N390 Urinary tract infection, site not specified: Secondary | ICD-10-CM

## 2012-02-17 DIAGNOSIS — R319 Hematuria, unspecified: Secondary | ICD-10-CM

## 2012-02-17 DIAGNOSIS — N8111 Cystocele, midline: Secondary | ICD-10-CM

## 2012-02-17 DIAGNOSIS — IMO0002 Reserved for concepts with insufficient information to code with codable children: Secondary | ICD-10-CM

## 2012-02-17 LAB — POCT URINALYSIS DIPSTICK
Bilirubin, UA: NEGATIVE
Glucose, UA: NEGATIVE
Ketones, UA: NEGATIVE
Nitrite, UA: NEGATIVE
Spec Grav, UA: >=1.03

## 2012-02-17 MED ORDER — NITROFURANTOIN MONOHYD MACRO 100 MG PO CAPS: 100.0000 mg | ORAL_CAPSULE | Freq: Two times a day (BID) | ORAL | Status: AC

## 2012-02-17 NOTE — Patient Instructions (Signed)
It was good to see you today. Macrobid antibiotics - Your prescription(s) have been submitted to your pharmacy. Please take as directed and contact our office if you believe you are having problem(s) with the medication(s).  we'll make referral to female gynecologist because of your bleeding. Our office will contact you regarding appointment(s) once made.

## 2012-02-17 NOTE — Progress Notes (Signed)
  Subjective:    Patient ID: Regina Williams, female    DOB: 1925/06/25, 78 y.o.   MRN: [id number]  HPI  complains of ?hematuria vs vaginal bleeding Onset this AM - large clot of bright red blood "fell" into toilet, not associated with BM or urination Denies rectal pain and feels clot cam from vaginal area Hx same 10/2010 - eval at that time by [facility] (dx UTI) but denies gyn eval in past 10 years Continued blood on underwear/pad and TP but no other clots since occurrence this AM No pain or burning  Past Medical History  Diagnosis Date  . Anxiety   . Cancer 1994    breast, right  . Hyperlipidemia   . Hypertension   . Osteoporosis   . Dry eyes   . Cystocele     Review of Systems  Constitutional: Negative for fever, fatigue and unexpected weight change.  Gastrointestinal: Negative for nausea, vomiting, abdominal pain, diarrhea, constipation, blood in stool, anal bleeding and rectal pain.  Genitourinary: Positive for hematuria and vaginal bleeding. Negative for dysuria, difficulty urinating, vaginal pain and pelvic pain.  Musculoskeletal: Negative for back pain and gait problem.       Objective:   Physical Exam BP 122/72  Pulse 93  Temp 97.4 F (36.3 C) (Oral)  Wt 113 lb (51.256 kg)  SpO2 96% Wt Readings from Last 3 Encounters:  02/17/12 113 lb (51.256 kg)  10/03/11 114 lb (51.71 kg)  03/22/11 112 lb (50.803 kg)   Constitutional: She is frail, but appears well-developed and well-nourished. No distress.  Eyes: Conjunctivae and EOM are normal. Pupils are equal, round, and reactive to light. No scleral icterus.  Neck: Normal range of motion. Neck supple. No JVD present. No thyromegaly present.  Cardiovascular: Normal rate, regular rhythm and normal heart sounds.  No murmur heard. No BLE edema. Pulmonary/Chest: Effort normal and breath sounds normal. No respiratory distress. She has no wheezes.  Abdominal: Soft. Bowel sounds are normal. She exhibits no distension. There is no  tenderness. no masses GU: defer to gyn Neurological: She is alert and oriented to person, place, and time. No cranial nerve deficit. Coordination normal.  Skin: Skin is warm and dry. No rash noted. No erythema.  Psychiatric: She has a normal mood and affect. Her behavior is normal. Judgment and thought content normal.   Lab Results  Component Value Date   WBC 5.1 03/22/2011   HGB 14.4 03/22/2011   HCT 42.5 03/22/2011   PLT 208.0 03/22/2011   GLUCOSE 96 03/22/2011   CHOL 165 03/22/2011   TRIG 55.0 03/22/2011   HDL 73.00 03/22/2011   LDLCALC 81 03/22/2011   ALT 21 03/22/2011   AST 21 03/22/2011   NA 142 03/22/2011   K 3.9 03/22/2011   CL 104 03/22/2011   CREATININE 0.6 03/22/2011   BUN 27* 03/22/2011   CO2 30 03/22/2011   TSH 1.64 03/22/2011   +mod hemoglobin on POC UA     Assessment & Plan:   Hematuria -  ?PMP vaginal bleeding Cystocele   tx for UTI and refer to gyn for evaluation

## 2012-04-05 ENCOUNTER — Ambulatory Visit: Payer: 59 | Admitting: Internal Medicine

## 2012-04-06 ENCOUNTER — Encounter: Payer: Self-pay | Admitting: Internal Medicine

## 2012-04-16 ENCOUNTER — Ambulatory Visit (INDEPENDENT_AMBULATORY_CARE_PROVIDER_SITE_OTHER): Payer: 59 | Admitting: Internal Medicine

## 2012-04-16 ENCOUNTER — Encounter: Payer: Self-pay | Admitting: Internal Medicine

## 2012-04-16 VITALS — BP 122/68 | HR 91 | Temp 97.1°F | Wt 112.0 lb

## 2012-04-16 DIAGNOSIS — E785 Hyperlipidemia, unspecified: Secondary | ICD-10-CM

## 2012-04-16 DIAGNOSIS — Z23 Encounter for immunization: Secondary | ICD-10-CM

## 2012-04-16 DIAGNOSIS — I10 Essential (primary) hypertension: Secondary | ICD-10-CM

## 2012-04-16 DIAGNOSIS — I6529 Occlusion and stenosis of unspecified carotid artery: Secondary | ICD-10-CM

## 2012-04-16 DIAGNOSIS — Z853 Personal history of malignant neoplasm of breast: Secondary | ICD-10-CM

## 2012-04-16 DIAGNOSIS — F411 Generalized anxiety disorder: Secondary | ICD-10-CM

## 2012-04-16 DIAGNOSIS — R202 Paresthesia of skin: Secondary | ICD-10-CM

## 2012-04-16 DIAGNOSIS — R209 Unspecified disturbances of skin sensation: Secondary | ICD-10-CM

## 2012-04-16 MED ORDER — LORAZEPAM 1 MG PO TABS: 1.0000 mg | ORAL_TABLET | Freq: Two times a day (BID) | ORAL | Status: DC | PRN

## 2012-04-16 NOTE — Addendum Note (Signed)
Addended by: Merrilyn Puma on: 04/16/2012 03:48 PM   Modules accepted: Orders

## 2012-04-16 NOTE — Assessment & Plan Note (Signed)
Doppler US 

## 2012-04-16 NOTE — Assessment & Plan Note (Signed)
Continue with current prescription therapy as reflected on the Med list.  

## 2012-04-16 NOTE — Progress Notes (Signed)
   Subjective:    Patient ID: Regina Williams, female    DOB: [birth year], 78 y.o.   MRN: [id number]  HPI  F/u PVD, anxiety, osteoporosis F/u vag bleed - saw Dr Algie Coffer - pessary was installed   BP Readings from Last 3 Encounters:  04/16/12 122/68  02/17/12 122/72  10/03/11 140/80   Wt Readings from Last 3 Encounters:  04/16/12 112 lb (50.803 kg)  02/17/12 113 lb (51.256 kg)  10/03/11 114 lb (51.71 kg)      Review of Systems  Constitutional: Negative for fever, chills, diaphoresis, activity change, appetite change, fatigue and unexpected weight change.  HENT: Negative for hearing loss, ear pain, congestion, sore throat, sneezing, mouth sores, neck pain, dental problem, voice change, postnasal drip and sinus pressure.   Eyes: Negative for pain and visual disturbance.  Respiratory: Negative for cough, chest tightness, wheezing and stridor.   Cardiovascular: Negative for chest pain, palpitations and leg swelling.  Gastrointestinal: Negative for nausea, vomiting, abdominal pain, blood in stool, abdominal distention and rectal pain.  Genitourinary: Negative for dysuria, hematuria, decreased urine volume, vaginal bleeding, vaginal discharge, difficulty urinating, vaginal pain and menstrual problem.  Musculoskeletal: Negative for back pain, joint swelling and gait problem.  Skin: Negative for color change, rash and wound.  Neurological: Negative for dizziness, tremors, syncope, speech difficulty and light-headedness.  Hematological: Negative for adenopathy.  Psychiatric/Behavioral: Positive for disturbed wake/sleep cycle (sometimes) and decreased concentration. Negative for suicidal ideas, hallucinations, behavioral problems, confusion and dysphoric mood. The patient is not hyperactive.        Objective:   Physical Exam  Constitutional: She appears well-developed and well-nourished. No distress.  HENT:  Head: Normocephalic.  Right Ear: External ear normal.  Left Ear: External ear  normal.  Nose: Nose normal.  Mouth/Throat: Oropharynx is clear and moist.  Eyes: Conjunctivae normal are normal. Pupils are equal, round, and reactive to light. Right eye exhibits no discharge. Left eye exhibits no discharge.  Neck: Normal range of motion. Neck supple. No JVD present. No tracheal deviation present. No thyromegaly present.  Cardiovascular: Normal rate, regular rhythm and normal heart sounds.   Pulmonary/Chest: No stridor. No respiratory distress. She has no wheezes.  Abdominal: Soft. Bowel sounds are normal. She exhibits no distension and no mass. There is no tenderness. There is no rebound and no guarding.  Musculoskeletal: She exhibits no edema and no tenderness.  Lymphadenopathy:    She has no cervical adenopathy.  Neurological: She displays normal reflexes. No cranial nerve deficit. She exhibits normal muscle tone. Coordination normal.  Skin: No rash noted. No erythema.  Psychiatric: She has a normal mood and affect. Her behavior is normal. Judgment and thought content normal.      Lab Results  Component Value Date   WBC 5.1 03/22/2011   HGB 14.4 03/22/2011   HCT 42.5 03/22/2011   PLT 208.0 03/22/2011   GLUCOSE 96 03/22/2011   CHOL 165 03/22/2011   TRIG 55.0 03/22/2011   HDL 73.00 03/22/2011   LDLCALC 81 03/22/2011   ALT 21 03/22/2011   AST 21 03/22/2011   NA 142 03/22/2011   K 3.9 03/22/2011   CL 104 03/22/2011   CREATININE 0.6 03/22/2011   BUN 27* 03/22/2011   CO2 30 03/22/2011   TSH 1.64 03/22/2011        Assessment & Plan:

## 2012-04-16 NOTE — Assessment & Plan Note (Signed)
Doing well 

## 2012-04-19 ENCOUNTER — Other Ambulatory Visit (INDEPENDENT_AMBULATORY_CARE_PROVIDER_SITE_OTHER): Payer: 59

## 2012-04-19 DIAGNOSIS — R209 Unspecified disturbances of skin sensation: Secondary | ICD-10-CM

## 2012-04-19 DIAGNOSIS — Z853 Personal history of malignant neoplasm of breast: Secondary | ICD-10-CM

## 2012-04-19 DIAGNOSIS — F411 Generalized anxiety disorder: Secondary | ICD-10-CM

## 2012-04-19 DIAGNOSIS — R202 Paresthesia of skin: Secondary | ICD-10-CM

## 2012-04-19 DIAGNOSIS — I6529 Occlusion and stenosis of unspecified carotid artery: Secondary | ICD-10-CM

## 2012-04-19 DIAGNOSIS — I10 Essential (primary) hypertension: Secondary | ICD-10-CM

## 2012-04-19 LAB — HEPATIC FUNCTION PANEL
ALT: 20 U/L (ref 0–35)
Albumin: 4 g/dL (ref 3.5–5.2)
Total Protein: 6.8 g/dL (ref 6.0–8.3)

## 2012-04-19 LAB — CBC WITH DIFFERENTIAL/PLATELET
Basophils Relative: 0.7 % (ref 0.0–3.0)
Eosinophils Relative: 4.9 % (ref 0.0–5.0)
Lymphocytes Relative: 21.6 % (ref 12.0–46.0)
MCV: 89.4 fl (ref 78.0–100.0)
Monocytes Absolute: 0.9 10*3/uL (ref 0.1–1.0)
Monocytes Relative: 13.8 % — ABNORMAL HIGH (ref 3.0–12.0)
Neutrophils Relative %: 59 % (ref 43.0–77.0)
RBC: 4.8 Mil/uL (ref 3.87–5.11)
WBC: 6.5 10*3/uL (ref 4.5–10.5)

## 2012-04-19 LAB — URINALYSIS, ROUTINE W REFLEX MICROSCOPIC
Ketones, ur: NEGATIVE
Nitrite: NEGATIVE
Specific Gravity, Urine: 1.01 (ref 1.000–1.030)
Urobilinogen, UA: 0.2 (ref 0.0–1.0)
pH: 6 (ref 5.0–8.0)

## 2012-04-19 LAB — LIPID PANEL
HDL: 65.1 mg/dL (ref >39.00)
LDL Cholesterol: 100 mg/dL — ABNORMAL HIGH (ref 0–99)
Total CHOL/HDL Ratio: 3
Triglycerides: 60 mg/dL (ref 0.0–149.0)
VLDL: 12 mg/dL (ref 0.0–40.0)

## 2012-04-19 LAB — BASIC METABOLIC PANEL
Chloride: 101 mEq/L (ref 96–112)
Creatinine, Ser: 0.8 mg/dL (ref 0.4–1.2)
GFR: 78.08 mL/min (ref >60.00)

## 2012-04-23 ENCOUNTER — Ambulatory Visit: Payer: 59 | Admitting: Internal Medicine

## 2012-05-17 ENCOUNTER — Telehealth: Payer: Self-pay | Admitting: Internal Medicine

## 2012-05-17 NOTE — Telephone Encounter (Signed)
Noted -  OV if needed pls Thx

## 2012-05-17 NOTE — Telephone Encounter (Signed)
Caller: [name]/Patient; Patient Name: Regina Williams; PCP: Sonda Primes (Adults only); Best Callback Phone Number: [phone]  states back in July she was having left arm and shoulder pain, she said it was slowly improving and then 10-21 she fell into some bushes and since arm pain has come back.  She has pain mostly at night when she tries to sleep in positioning   Neurovascular intact.  All emergent sxs per Shoulder Injury ruled out except for new onset of mild to moderate pain that has not improved with 24 hours of home care   Appt made for 1025 at 1115 with Dr Posey Rea.  she also wanted him to know that Dr McDermitt has scheduled Abd CT for her liver and kidneys

## 2012-05-18 ENCOUNTER — Encounter: Payer: Self-pay | Admitting: Internal Medicine

## 2012-05-18 ENCOUNTER — Ambulatory Visit (INDEPENDENT_AMBULATORY_CARE_PROVIDER_SITE_OTHER): Payer: 59 | Admitting: Internal Medicine

## 2012-05-18 VITALS — BP 138/76 | HR 86 | Temp 97.8°F | Resp 16 | Wt 113.2 lb

## 2012-05-18 DIAGNOSIS — M25512 Pain in left shoulder: Secondary | ICD-10-CM | POA: Insufficient documentation

## 2012-05-18 DIAGNOSIS — M25519 Pain in unspecified shoulder: Secondary | ICD-10-CM

## 2012-05-18 MED ORDER — LOVASTATIN 20 MG PO TABS: 20.0000 mg | ORAL_TABLET | Freq: Every day | ORAL | Status: DC

## 2012-05-18 MED ORDER — MELOXICAM 7.5 MG PO TABS: 7.5000 mg | ORAL_TABLET | Freq: Every day | ORAL | Status: DC

## 2012-05-18 NOTE — Patient Instructions (Addendum)
Postprocedure instructions :    A Band-Aid should be left on for 12 hours. Injection therapy is not a cure itself. It is used in conjunction with other modalities. You can use nonsteroidal anti-inflammatories like ibuprofen , hot and cold compresses. Rest is recommended in the next 24 hours. You need to report immediately  if fever, chills or any signs of infection develop.  Exercises for range of motion

## 2012-05-18 NOTE — Assessment & Plan Note (Signed)
10/13 - rot cuff strain Will inject Meloxicam prn 7.5 mg

## 2012-05-18 NOTE — Progress Notes (Signed)
Subjective:    Patient ID: Regina Williams, female    DOB: 03/17/1925, 78 y.o.   MRN: [id number]  HPI C/o L shoulder pain since Jul 2013 - it started after she washed windows. It is worse in am afer sleeping or with lifting F/u PVD, anxiety, osteoporosis F/u vag bleed - saw Dr Algie Coffer - pessary was installed   BP Readings from Last 3 Encounters:  05/18/12 138/76  04/16/12 122/68  02/17/12 122/72   Wt Readings from Last 3 Encounters:  05/18/12 113 lb 4 oz (51.37 kg)  04/16/12 112 lb (50.803 kg)  02/17/12 113 lb (51.256 kg)      Review of Systems  Constitutional: Negative for fever, chills, diaphoresis, activity change, appetite change, fatigue and unexpected weight change.  HENT: Negative for hearing loss, ear pain, congestion, sore throat, sneezing, mouth sores, neck pain, dental problem, voice change, postnasal drip and sinus pressure.   Eyes: Negative for pain and visual disturbance.  Respiratory: Negative for cough, chest tightness, wheezing and stridor.   Cardiovascular: Negative for chest pain, palpitations and leg swelling.  Gastrointestinal: Negative for nausea, vomiting, abdominal pain, blood in stool, abdominal distention and rectal pain.  Genitourinary: Negative for dysuria, hematuria, decreased urine volume, vaginal bleeding, vaginal discharge, difficulty urinating, vaginal pain and menstrual problem.  Musculoskeletal: Negative for back pain, joint swelling and gait problem.  Skin: Negative for color change, rash and wound.  Neurological: Negative for dizziness, tremors, syncope, speech difficulty and light-headedness.  Hematological: Negative for adenopathy.  Psychiatric/Behavioral: Positive for disturbed wake/sleep cycle (sometimes) and decreased concentration. Negative for suicidal ideas, hallucinations, behavioral problems, confusion and dysphoric mood. The patient is not hyperactive.        Objective:   Physical Exam  Constitutional: She appears well-developed  and well-nourished. No distress.  HENT:  Head: Normocephalic.  Right Ear: External ear normal.  Left Ear: External ear normal.  Nose: Nose normal.  Mouth/Throat: Oropharynx is clear and moist.  Eyes: Conjunctivae normal are normal. Pupils are equal, round, and reactive to light. Right eye exhibits no discharge. Left eye exhibits no discharge.  Neck: Normal range of motion. Neck supple. No JVD present. No tracheal deviation present. No thyromegaly present.  Cardiovascular: Normal rate, regular rhythm and normal heart sounds.   Pulmonary/Chest: No stridor. No respiratory distress. She has no wheezes.  Abdominal: Soft. Bowel sounds are normal. She exhibits no distension and no mass. There is no tenderness. There is no rebound and no guarding.  Musculoskeletal: She exhibits no edema and no tenderness.  Lymphadenopathy:    She has no cervical adenopathy.  Neurological: She displays normal reflexes. No cranial nerve deficit. She exhibits normal muscle tone. Coordination normal.  Skin: No rash noted. No erythema.  Psychiatric: She has a normal mood and affect. Her behavior is normal. Judgment and thought content normal.      Lab Results  Component Value Date   WBC 6.5 04/19/2012   HGB 14.2 04/19/2012   HCT 42.9 04/19/2012   PLT 215.0 04/19/2012   GLUCOSE 95 04/19/2012   CHOL 177 04/19/2012   TRIG 60.0 04/19/2012   HDL 65.10 04/19/2012   LDLCALC 100* 04/19/2012   ALT 20 04/19/2012   AST 22 04/19/2012   NA 139 04/19/2012   K 3.8 04/19/2012   CL 101 04/19/2012   CREATININE 0.8 04/19/2012   BUN 22 04/19/2012   CO2 30 04/19/2012   TSH 3.38 04/19/2012    Procedure :Joint Injection, L  shoulder   Indication:  Subacromial bursitis with refractory  chronic pain.   Risks including unsuccessful procedure , bleeding, infection, bruising, skin atrophy and others were explained to the patient in detail as well as the benefits. Informed consent was obtained and signed.   Tthe patient was placed in a  comfortable position. Lateral approach was used. Skin was prepped with Betadine and alcohol  and anesthetized with a cooling spray. Then, a 5 cc syringe with a 2 inch long 24-gauge needle was used for a joint injection.. The needle was advanced  Into the subacromial space.The bursa was injected with 3 mL of 2% lidocaine and 40 mg of Depo-Medrol .  Band-Aid was applied.   Tolerated well. Complications: None. Good pain relief following the procedure.   Postprocedure instructions :    A Band-Aid should be left on for 12 hours. Injection therapy is not a cure itself. It is used in conjunction with other modalities. You can use nonsteroidal anti-inflammatories like ibuprofen , hot and cold compresses. Rest is recommended in the next 24 hours. You need to report immediately  if fever, chills or any signs of infection develop.       Assessment & Plan:

## 2012-05-20 MED ORDER — METHYLPREDNISOLONE ACETATE 80 MG/ML IJ SUSP: 40.0000 mg | Freq: Once | INTRAMUSCULAR | Status: DC

## 2012-06-05 ENCOUNTER — Other Ambulatory Visit: Payer: Self-pay | Admitting: *Deleted

## 2012-06-05 ENCOUNTER — Telehealth: Payer: Self-pay | Admitting: *Deleted

## 2012-06-05 DIAGNOSIS — K769 Liver disease, unspecified: Secondary | ICD-10-CM

## 2012-06-05 NOTE — Telephone Encounter (Signed)
I called pt per Dr. Posey Rea- she is ok with scheduling MRI of liver based on 05/23/12 CT from Dr. Carolan Shiver.

## 2012-06-05 NOTE — Telephone Encounter (Signed)
MRI ordered. Thx

## 2012-06-11 ENCOUNTER — Other Ambulatory Visit: Payer: Self-pay | Admitting: *Deleted

## 2012-06-11 ENCOUNTER — Other Ambulatory Visit (INDEPENDENT_AMBULATORY_CARE_PROVIDER_SITE_OTHER): Payer: 59

## 2012-06-11 DIAGNOSIS — K7689 Other specified diseases of liver: Secondary | ICD-10-CM

## 2012-06-11 DIAGNOSIS — K769 Liver disease, unspecified: Secondary | ICD-10-CM

## 2012-06-11 LAB — BASIC METABOLIC PANEL
BUN: 27 mg/dL — ABNORMAL HIGH (ref 6–23)
Calcium: 9.2 mg/dL (ref 8.4–10.5)
Creatinine, Ser: 0.7 mg/dL (ref 0.4–1.2)
GFR: 83.15 mL/min (ref >60.00)
Glucose, Bld: 99 mg/dL (ref 70–99)

## 2012-06-13 ENCOUNTER — Ambulatory Visit (HOSPITAL_COMMUNITY)
Admission: RE | Admit: 2012-06-13 | Discharge: 2012-06-13 | Disposition: A | Discharge status: Home / Self Care | Payer: Medicare Other | Source: Ambulatory Visit | Attending: Internal Medicine | Admitting: Internal Medicine

## 2012-06-13 DIAGNOSIS — Z9089 Acquired absence of other organs: Secondary | ICD-10-CM | POA: Insufficient documentation

## 2012-06-13 DIAGNOSIS — C50919 Malignant neoplasm of unspecified site of unspecified female breast: Secondary | ICD-10-CM | POA: Insufficient documentation

## 2012-06-13 DIAGNOSIS — K769 Liver disease, unspecified: Secondary | ICD-10-CM

## 2012-06-13 DIAGNOSIS — K7689 Other specified diseases of liver: Secondary | ICD-10-CM | POA: Insufficient documentation

## 2012-06-13 LAB — CREATININE, SERUM
GFR calc Af Amer: 86 mL/min — ABNORMAL LOW (ref >90)
GFR calc non Af Amer: 74 mL/min — ABNORMAL LOW (ref >90)

## 2012-06-13 MED ORDER — GADOBENATE DIMEGLUMINE 529 MG/ML IV SOLN
10.0000 mL | Freq: Once | INTRAVENOUS | Status: AC | PRN
  Administered 2012-06-13: 10 mL via INTRAVENOUS

## 2012-07-06 ENCOUNTER — Encounter: Payer: Self-pay | Admitting: Internal Medicine

## 2012-07-06 ENCOUNTER — Ambulatory Visit (INDEPENDENT_AMBULATORY_CARE_PROVIDER_SITE_OTHER): Payer: 59 | Admitting: Internal Medicine

## 2012-07-06 VITALS — BP 120/80 | HR 80 | Temp 97.6°F | Resp 16 | Ht 61.0 in | Wt 110.0 lb

## 2012-07-06 DIAGNOSIS — Z23 Encounter for immunization: Secondary | ICD-10-CM

## 2012-07-06 DIAGNOSIS — Z0000 Encounter for general adult medical examination without abnormal findings: Secondary | ICD-10-CM

## 2012-07-06 DIAGNOSIS — Z2911 Encounter for prophylactic immunotherapy for respiratory syncytial virus (RSV): Secondary | ICD-10-CM

## 2012-07-06 NOTE — Assessment & Plan Note (Signed)
The patient is here for annual Medicare wellness examination and management of other chronic and acute problems.   The risk factors are reflected in the social history.  The roster of all physicians providing medical care to patient - is listed in the Snapshot section of the chart.  Activities of daily living:  The patient is 100% inedpendent in all ADLs: dressing, toileting, feeding as well as independent mobility  Home safety : good. Using seatbelts. There is no violence in the home.   There is no risks for hepatitis, STDs or HIV. There is no history of blood transfusion. They have no travel history to infectious disease endemic areas of the world.  The patient has  seen their dentist in the last 12 month. They have  seen their eye doctor in the last year. They deny  Any major hearing difficulty and have not had audiologic testing in the last year.  They do not  have excessive sun exposure. Discussed the need for sun protection: hats, long sleeves and use of sunscreen if there is significant sun exposure.   Diet: the importance of a healthy diet is discussed. They do have a reasonably healthy  diet.  The patient has a fairly regular exercise program of a mixed nature: walking, yard work, etc.The benefits of regular aerobic exercise were discussed.  Depression screen: there are no signs or vegative symptoms of depression- irritability, change in appetite, anhedonia, sadness/tearfullness.  Cognitive assessment: the patient manages all their financial and personal affairs and is actively engaged. They could relate day,date,year and events; recalled 3/3 objects at 3 minutes  The following portions of the patient's history were reviewed and updated as appropriate: allergies, current medications, past family history, past medical history,  past surgical history, past social history  and problem list.  Vision, hearing, body mass index were assessed and reviewed.   During the course of the visit  the patient was educated and counseled about appropriate screening and preventive services including : fall prevention , diabetes screening, nutrition counseling, colorectal cancer screening, and recommended immunizations.  No need for colonoscopy

## 2012-07-06 NOTE — Progress Notes (Signed)
Subjective:    Patient ID: Regina Williams, female    DOB: 03/14/1925, 78 y.o.   MRN: [id number]  HPI  The patient is here for a wellness exam. The patient has been doing well overall without major physical or psychological issues going on lately.  F/u L shoulder pain since Jul 2013 - it started after she washed windows. It is much better now F/u PVD, anxiety, osteoporosis   BP Readings from Last 3 Encounters:  07/06/12 120/80  05/18/12 138/76  04/16/12 122/68   Wt Readings from Last 3 Encounters:  07/06/12 110 lb (49.896 kg)  05/18/12 113 lb 4 oz (51.37 kg)  04/16/12 112 lb (50.803 kg)      Review of Systems  Constitutional: Negative for fever, chills, diaphoresis, activity change, appetite change, fatigue and unexpected weight change.  HENT: Negative for hearing loss, ear pain, congestion, sore throat, sneezing, mouth sores, neck pain, dental problem, voice change, postnasal drip and sinus pressure.   Eyes: Negative for pain and visual disturbance.  Respiratory: Negative for cough, chest tightness, wheezing and stridor.   Cardiovascular: Negative for chest pain, palpitations and leg swelling.  Gastrointestinal: Negative for nausea, vomiting, abdominal pain, blood in stool, abdominal distention and rectal pain.  Genitourinary: Negative for dysuria, hematuria, decreased urine volume, vaginal bleeding, vaginal discharge, difficulty urinating, vaginal pain and menstrual problem.  Musculoskeletal: Negative for back pain, joint swelling and gait problem.  Skin: Negative for color change, rash and wound.  Neurological: Negative for dizziness, tremors, syncope, speech difficulty and light-headedness.  Hematological: Negative for adenopathy.  Psychiatric/Behavioral: Positive for sleep disturbance (sometimes) and decreased concentration. Negative for suicidal ideas, hallucinations, behavioral problems, confusion and dysphoric mood. The patient is not hyperactive.        Objective:   Physical Exam  Constitutional: She appears well-developed and well-nourished. No distress.  HENT:  Head: Normocephalic.  Right Ear: External ear normal.  Left Ear: External ear normal.  Nose: Nose normal.  Mouth/Throat: Oropharynx is clear and moist.  Eyes: Conjunctivae normal are normal. Pupils are equal, round, and reactive to light. Right eye exhibits no discharge. Left eye exhibits no discharge.  Neck: Normal range of motion. Neck supple. No JVD present. No tracheal deviation present. No thyromegaly present.  Cardiovascular: Normal rate, regular rhythm and normal heart sounds.   Pulmonary/Chest: No stridor. No respiratory distress. She has no wheezes.  Abdominal: Soft. Bowel sounds are normal. She exhibits no distension and no mass. There is no tenderness. There is no rebound and no guarding.  Musculoskeletal: She exhibits no edema and no tenderness.  Lymphadenopathy:    She has no cervical adenopathy.  Neurological: She displays normal reflexes. No cranial nerve deficit. She exhibits normal muscle tone. Coordination normal.  Skin: No rash noted. No erythema.  Psychiatric: She has a normal mood and affect. Her behavior is normal. Judgment and thought content normal.      Lab Results  Component Value Date   WBC 6.5 04/19/2012   HGB 14.2 04/19/2012   HCT 42.9 04/19/2012   PLT 215.0 04/19/2012   GLUCOSE 99 06/11/2012   CHOL 177 04/19/2012   TRIG 60.0 04/19/2012   HDL 65.10 04/19/2012   LDLCALC 100* 04/19/2012   ALT 20 04/19/2012   AST 22 04/19/2012   NA 139 06/11/2012   K 3.7 06/11/2012   CL 102 06/11/2012   CREATININE 0.79 06/13/2012   BUN 27* 06/11/2012   CO2 32 06/11/2012   TSH 3.38 04/19/2012  Assessment & Plan:

## 2012-10-16 ENCOUNTER — Ambulatory Visit: Payer: 59 | Admitting: Internal Medicine

## 2012-11-05 ENCOUNTER — Encounter: Payer: Self-pay | Admitting: Internal Medicine

## 2012-11-05 ENCOUNTER — Ambulatory Visit (INDEPENDENT_AMBULATORY_CARE_PROVIDER_SITE_OTHER): Payer: Managed Care, Other (non HMO) | Admitting: Internal Medicine

## 2012-11-05 VITALS — BP 160/80 | HR 76 | Temp 97.5°F | Resp 16 | Wt 109.0 lb

## 2012-11-05 DIAGNOSIS — E785 Hyperlipidemia, unspecified: Secondary | ICD-10-CM

## 2012-11-05 DIAGNOSIS — K921 Melena: Secondary | ICD-10-CM

## 2012-11-05 DIAGNOSIS — Z0000 Encounter for general adult medical examination without abnormal findings: Secondary | ICD-10-CM

## 2012-11-05 DIAGNOSIS — F411 Generalized anxiety disorder: Secondary | ICD-10-CM

## 2012-11-05 DIAGNOSIS — I10 Essential (primary) hypertension: Secondary | ICD-10-CM

## 2012-11-05 DIAGNOSIS — M25512 Pain in left shoulder: Secondary | ICD-10-CM

## 2012-11-05 DIAGNOSIS — M25519 Pain in unspecified shoulder: Secondary | ICD-10-CM

## 2012-11-05 DIAGNOSIS — M810 Age-related osteoporosis without current pathological fracture: Secondary | ICD-10-CM

## 2012-11-05 MED ORDER — LORAZEPAM 1 MG PO TABS: 1.0000 mg | ORAL_TABLET | Freq: Two times a day (BID) | ORAL | Status: DC | PRN

## 2012-11-05 NOTE — Assessment & Plan Note (Signed)
Continue with current prescription therapy as reflected on the Med list.  

## 2012-11-05 NOTE — Assessment & Plan Note (Signed)
Vit D 

## 2012-11-05 NOTE — Assessment & Plan Note (Signed)
Resolved

## 2012-11-05 NOTE — Assessment & Plan Note (Signed)
GI cons dr Juanda Chance
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
## 2012-11-07 ENCOUNTER — Encounter: Payer: Self-pay | Admitting: Internal Medicine

## 2012-11-12 ENCOUNTER — Encounter: Payer: Self-pay | Admitting: *Deleted

## 2012-11-19 ENCOUNTER — Encounter (HOSPITAL_COMMUNITY): Payer: Self-pay | Admitting: Emergency Medicine

## 2012-11-19 ENCOUNTER — Emergency Department (HOSPITAL_COMMUNITY)
Admission: EM | Admit: 2012-11-19 | Discharge: 2012-11-19 | Disposition: A | Discharge status: Home / Self Care | Payer: Medicare HMO | Attending: Emergency Medicine | Admitting: Emergency Medicine

## 2012-11-19 ENCOUNTER — Emergency Department (HOSPITAL_COMMUNITY): Payer: Medicare HMO

## 2012-11-19 ENCOUNTER — Emergency Department (INDEPENDENT_AMBULATORY_CARE_PROVIDER_SITE_OTHER)
Admission: EM | Admit: 2012-11-19 | Discharge: 2012-11-19 | Disposition: A | Discharge status: Nursing Home (Includes ICF) | Payer: Managed Care, Other (non HMO) | Source: Home / Self Care | Attending: Emergency Medicine | Admitting: Emergency Medicine

## 2012-11-19 DIAGNOSIS — Y92410 Unspecified street and highway as the place of occurrence of the external cause: Secondary | ICD-10-CM | POA: Insufficient documentation

## 2012-11-19 DIAGNOSIS — F411 Generalized anxiety disorder: Secondary | ICD-10-CM | POA: Insufficient documentation

## 2012-11-19 DIAGNOSIS — M810 Age-related osteoporosis without current pathological fracture: Secondary | ICD-10-CM | POA: Insufficient documentation

## 2012-11-19 DIAGNOSIS — Z79899 Other long term (current) drug therapy: Secondary | ICD-10-CM | POA: Insufficient documentation

## 2012-11-19 DIAGNOSIS — Z8742 Personal history of other diseases of the female genital tract: Secondary | ICD-10-CM | POA: Insufficient documentation

## 2012-11-19 DIAGNOSIS — Z8679 Personal history of other diseases of the circulatory system: Secondary | ICD-10-CM | POA: Insufficient documentation

## 2012-11-19 DIAGNOSIS — Z8719 Personal history of other diseases of the digestive system: Secondary | ICD-10-CM | POA: Insufficient documentation

## 2012-11-19 DIAGNOSIS — S7012XA Contusion of left thigh, initial encounter: Secondary | ICD-10-CM

## 2012-11-19 DIAGNOSIS — S7010XA Contusion of unspecified thigh, initial encounter: Secondary | ICD-10-CM | POA: Insufficient documentation

## 2012-11-19 DIAGNOSIS — Y9389 Activity, other specified: Secondary | ICD-10-CM | POA: Insufficient documentation

## 2012-11-19 DIAGNOSIS — Z853 Personal history of malignant neoplasm of breast: Secondary | ICD-10-CM | POA: Insufficient documentation

## 2012-11-19 DIAGNOSIS — Z7982 Long term (current) use of aspirin: Secondary | ICD-10-CM | POA: Insufficient documentation

## 2012-11-19 DIAGNOSIS — Z8669 Personal history of other diseases of the nervous system and sense organs: Secondary | ICD-10-CM | POA: Insufficient documentation

## 2012-11-19 DIAGNOSIS — I10 Essential (primary) hypertension: Secondary | ICD-10-CM | POA: Insufficient documentation

## 2012-11-19 DIAGNOSIS — S301XXA Contusion of abdominal wall, initial encounter: Secondary | ICD-10-CM

## 2012-11-19 DIAGNOSIS — E785 Hyperlipidemia, unspecified: Secondary | ICD-10-CM | POA: Insufficient documentation

## 2012-11-19 LAB — POCT I-STAT, CHEM 8
BUN: 29 mg/dL — ABNORMAL HIGH (ref 6–23)
Calcium, Ion: 1.22 mmol/L (ref 1.13–1.30)
Chloride: 105 meq/L (ref 96–112)
Creatinine, Ser: 0.7 mg/dL (ref 0.50–1.10)
Glucose, Bld: 103 mg/dL — ABNORMAL HIGH (ref 70–99)
HCT: 39 % (ref 36.0–46.0)
Hemoglobin: 13.3 g/dL (ref 12.0–15.0)
Potassium: 3.9 mEq/L (ref 3.5–5.1)
Sodium: 142 mEq/L (ref 135–145)
TCO2: 30 mmol/L (ref 0–100)

## 2012-11-19 LAB — CK: Total CK: 87 U/L (ref 7–177)

## 2012-11-19 MED ORDER — SODIUM CHLORIDE 0.9 % IV BOLUS (SEPSIS)
1000.0000 mL | Freq: Once | INTRAVENOUS | Status: AC
  Administered 2012-11-19: 1000 mL via INTRAVENOUS
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
## 2012-11-19 NOTE — ED Notes (Signed)
Pt resting quietly at this time.  Friend at bedside.  Pt on cardiac monitor rate 84 NS.  Pt alert and oriented x's 3, skin warm and dry color appropriate
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
## 2012-11-19 NOTE — ED Notes (Signed)
Patient reports walking up and down steps at home several times and walking to and from car without difficulty, but leg hematoma is getting tighter and more painful

## 2012-11-19 NOTE — ED Notes (Signed)
Unknown last tetanus

## 2012-11-19 NOTE — ED Notes (Signed)
Nancy hertlein RN applied ice pack to thigh
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
## 2012-11-22 ENCOUNTER — Ambulatory Visit (INDEPENDENT_AMBULATORY_CARE_PROVIDER_SITE_OTHER): Payer: Medicare HMO | Admitting: Internal Medicine

## 2012-11-22 ENCOUNTER — Encounter: Payer: Self-pay | Admitting: Internal Medicine

## 2012-11-22 VITALS — BP 118/60 | HR 85 | Temp 97.1°F | Wt 105.8 lb

## 2012-11-22 DIAGNOSIS — T148XXA Other injury of unspecified body region, initial encounter: Secondary | ICD-10-CM

## 2012-11-22 NOTE — Patient Instructions (Signed)
It was good to see you today. We have reviewed your ER records including labs, xray and tests today Medications reviewed and updated, no changes recommended at this time. Give this time to heal - it may take 12 weeks before color is normal again Call if any fever, pain, heat or bright redness during healing Tylenol if needed for discomfort - call if problems
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
## 2012-12-19 ENCOUNTER — Ambulatory Visit (INDEPENDENT_AMBULATORY_CARE_PROVIDER_SITE_OTHER): Payer: Medicare HMO | Admitting: Internal Medicine

## 2012-12-19 ENCOUNTER — Encounter: Payer: Self-pay | Admitting: Internal Medicine

## 2012-12-19 ENCOUNTER — Telehealth: Payer: Self-pay | Admitting: Internal Medicine

## 2012-12-19 VITALS — BP 110/64 | HR 87 | Temp 97.6°F | Ht 61.0 in | Wt 110.0 lb

## 2012-12-19 DIAGNOSIS — I83893 Varicose veins of bilateral lower extremities with other complications: Secondary | ICD-10-CM

## 2012-12-19 MED ORDER — MEDICAL COMPRESSION STOCKINGS MISC: 1.0000 | Freq: Every day | Status: DC

## 2012-12-19 NOTE — Telephone Encounter (Signed)
Nicki Guadalajara informed of thigh high length of compression hoses, size small.

## 2012-12-19 NOTE — Patient Instructions (Addendum)
Varicose Veins  Varicose veins are veins that have become enlarged and twisted.  CAUSES  This condition is the result of valves in the veins not working properly. Valves in the veins help return blood from the leg to the heart. If these valves are damaged, blood flows backwards and backs up into the veins in the leg near the skin. This causes the veins to become larger. People who are on their feet a lot, who are pregnant, or who are overweight are more likely to develop varicose veins.  SYMPTOMS   · Bulging, twisted-appearing, bluish veins, most commonly found on the legs.  · Leg pain or a feeling of heaviness. These symptoms may be worse at the end of the day.  · Leg swelling.  · Skin color changes.  DIAGNOSIS   Varicose veins can usually be diagnosed with an exam of your legs by your caregiver. He or she may recommend an ultrasound of your leg veins.  TREATMENT   Most varicose veins can be treated at home. However, other treatments are available for people who have persistent symptoms or who want to treat the cosmetic appearance of the varicose veins. These include:  · Laser treatment of very small varicose veins.  · Medicine that is shot (injected) into the vein. This medicine hardens the walls of the vein and closes off the vein. This treatment is called sclerotherapy. Afterwards, you may need to wear clothing or bandages that apply pressure.  · Surgery.  HOME CARE INSTRUCTIONS   · Do not stand or sit in one position for long periods of time. Do not sit with your legs crossed. Rest with your legs raised during the day.  · Wear elastic stockings or support hose. Do not wear other tight, encircling garments around the legs, pelvis, or waist.  · Walk as much as possible to increase blood flow.  · Raise the foot of your bed at night with 2-inch blocks.  · If you get a cut in the skin over the vein and the vein bleeds, lie down with your leg raised and press on it with a clean cloth until the bleeding stops. Then  place a bandage (dressing) on the cut. See your caregiver if it continues to bleed or needs stitches.  SEEK MEDICAL CARE IF:   · The skin around your ankle starts to break down.  · You have pain, redness, tenderness, or hard swelling developing in your leg over a vein.  · You are uncomfortable due to leg pain.  Document Released: 04/20/2005 Document Revised: 10/03/2011 Document Reviewed: 09/06/2010  [employer]® Patient Information ©2014 [employer].

## 2012-12-19 NOTE — Telephone Encounter (Signed)
Nicki Guadalajara called from [facility] stated that they received order for for compression stocking from [facility] but there is no strengths for this order. Please call Nicki Guadalajara back

## 2012-12-19 NOTE — Telephone Encounter (Signed)
She needs a size small
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
## 2012-12-19 NOTE — Progress Notes (Signed)
Subjective:    Patient ID: Regina Williams, female    DOB: [birth year], 79 y.o.   MRN: [id number]  HPI  Pt presents to the clinic today with c/o right leg pain. She has varicose veins in her right leg. They are becoming more inflamed and tender. She notices that they bulge out more in the evening and are usually not showing first thing in the morning. She tries to keep her legs elevated as much as possible.  Review of Systems      Past Medical History  Diagnosis Date  . Anxiety   . Breast cancer 1994    right  . Hyperlipidemia   . Hypertension   . Osteoporosis   . Dry eyes   . Cystocele   . Internal hemorrhoids   . Liver lesion 2013    multiple    Current Outpatient Prescriptions  Medication Sig Dispense Refill  . aspirin 81 MG tablet Take 81 mg by mouth daily.        . calcium gluconate 500 MG tablet Take 500 mg by mouth 2 (two) times daily.        . cholecalciferol (VITAMIN D) 1000 UNITS tablet Take 2,000 Units by mouth daily.        . diphenhydrAMINE (BENADRYL) 25 mg capsule Take 25 mg by mouth at bedtime.        Marland Kitchen LORazepam (ATIVAN) 1 MG tablet Take 0.5 mg by mouth daily.      Marland Kitchen lovastatin (MEVACOR) 20 MG tablet Take 1 tablet (20 mg total) by mouth at bedtime.  90 tablet  3  . NON FORMULARY Prosthetic bra       . Polyethyl Glycol-Propyl Glycol (SYSTANE PRESERVATIVE FREE) 0.4-0.3 % SOLN Place 1 drop into both eyes at bedtime.        No current facility-administered medications for this visit.    No Known Allergies  Family History  Problem Relation Age of Onset  . Heart disease Mother 53    MI  . Colon cancer Neg Hx     History   Social History  . Marital Status: Widowed    Spouse Name: N/A    Number of Children: N/A  . Years of Education: N/A   Occupational History  . Not on file.   Social History Main Topics  . Smoking status: Never Smoker   . Smokeless tobacco: Not on file  . Alcohol Use: No  . Drug Use: No  . Sexually Active: No   Other Topics  Concern  . Not on file   Social History Narrative  . No narrative on file     Constitutional: Denies fever, malaise, fatigue, headache or abrupt weight changes.  Respiratory: Denies difficulty breathing, shortness of breath, cough or sputum production.   Cardiovascular: Pt reports varicose veins. Denies chest pain, chest tightness, palpitations or swelling in the hands or feet.  Musculoskeletal: Pt reports right leg pain. Denies decrease in range of motion, difficulty with gait, muscle pain or joint pain and swelling.  Skin: Denies redness, rashes, lesions or ulcercations.  .   No other specific complaints in a complete review of systems (except as listed in HPI above).  Objective:   Physical Exam    BP 110/64  Pulse 87  Temp(Src) 97.6 F (36.4 C) (Oral)  Ht 5\' 1"  (1.549 m)  Wt 110 lb (49.896 kg)  BMI 20.8 kg/m2  SpO2 96% Wt Readings from Last 3 Encounters:  12/19/12 110 lb (49.896 kg)  11/22/12 105  lb 12.8 oz (47.991 kg)  11/05/12 109 lb (49.442 kg)    General: Appears her stated age, well developed, well nourished in NAD. Skin: Warm, dry and intact. No rashes, lesions or ulcerations noted.  Cardiovascular: Normal rate and rhythm. S1,S2 noted.  No murmur, rubs or gallops noted. No JVD or BLE edema. No carotid bruits noted. Pulmonary/Chest: Normal effort and positive vesicular breath sounds. No respiratory distress. No wheezes, rales or ronchi noted. Multiple vericose veins and varicose knots noted on bilateral legs. Abdomen: Soft and nontender. Normal bowel sounds, no bruits noted. No distention or masses noted. Liver, spleen and kidneys non palpable. Musculoskeletal: Normal range of motion. No signs of joint swelling. No difficulty with gait.         Assessment & Plan:   Varicose veins of bilateral lower extremities:  Discussed with pt importance of compression and elevation RX for compression stockings  RTC as needed or if symptoms persist

## 2012-12-26 ENCOUNTER — Ambulatory Visit (INDEPENDENT_AMBULATORY_CARE_PROVIDER_SITE_OTHER): Payer: Medicare HMO | Admitting: Internal Medicine

## 2012-12-26 ENCOUNTER — Encounter: Payer: Self-pay | Admitting: Internal Medicine

## 2012-12-26 VITALS — BP 150/72 | HR 76 | Temp 97.2°F | Resp 16 | Wt 107.0 lb

## 2012-12-26 DIAGNOSIS — M25561 Pain in right knee: Secondary | ICD-10-CM

## 2012-12-26 DIAGNOSIS — F411 Generalized anxiety disorder: Secondary | ICD-10-CM

## 2012-12-26 DIAGNOSIS — S7012XA Contusion of left thigh, initial encounter: Secondary | ICD-10-CM

## 2012-12-26 DIAGNOSIS — I10 Essential (primary) hypertension: Secondary | ICD-10-CM

## 2012-12-26 DIAGNOSIS — S7010XA Contusion of unspecified thigh, initial encounter: Secondary | ICD-10-CM

## 2012-12-26 DIAGNOSIS — M25569 Pain in unspecified knee: Secondary | ICD-10-CM | POA: Insufficient documentation

## 2012-12-26 MED ORDER — MELOXICAM 7.5 MG PO TABS: 7.5000 mg | ORAL_TABLET | Freq: Every day | ORAL | Status: DC

## 2012-12-26 NOTE — Patient Instructions (Signed)
Postprocedure instructions :    A Band-Aid should be left on for 12 hours. Injection therapy is not a cure itself. It is used in conjunction with other modalities. You can use nonsteroidal anti-inflammatories like ibuprofen , hot and cold compresses. Rest is recommended in the next 24 hours. You need to report immediately  if fever, chills or any signs of infection develop. 
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
## 2012-12-26 NOTE — Assessment & Plan Note (Signed)
Worse 6/14 R

## 2012-12-27 DIAGNOSIS — S7012XA Contusion of left thigh, initial encounter: Secondary | ICD-10-CM | POA: Insufficient documentation

## 2012-12-27 MED ORDER — METHYLPREDNISOLONE ACETATE 40 MG/ML IJ SUSP: 40.0000 mg | Freq: Once | INTRAMUSCULAR | Status: AC

## 2012-12-27 NOTE — Assessment & Plan Note (Signed)
Confirmed by [place] [facility] [facility]

## 2012-12-27 NOTE — Assessment & Plan Note (Signed)
Continue with current prescription prn therapy as reflected on the Med list.  

## 2012-12-27 NOTE — Assessment & Plan Note (Signed)
Continue with current prescription therapy as reflected on the Med list.  

## 2012-12-28 ENCOUNTER — Ambulatory Visit: Payer: Managed Care, Other (non HMO) | Admitting: Internal Medicine

## 2013-01-04 ENCOUNTER — Ambulatory Visit (INDEPENDENT_AMBULATORY_CARE_PROVIDER_SITE_OTHER): Payer: Medicare HMO | Admitting: Internal Medicine

## 2013-01-04 ENCOUNTER — Other Ambulatory Visit (INDEPENDENT_AMBULATORY_CARE_PROVIDER_SITE_OTHER): Payer: Medicare HMO

## 2013-01-04 ENCOUNTER — Encounter: Payer: Self-pay | Admitting: Internal Medicine

## 2013-01-04 VITALS — BP 100/60 | HR 80 | Ht 61.0 in | Wt 107.4 lb

## 2013-01-04 DIAGNOSIS — K625 Hemorrhage of anus and rectum: Secondary | ICD-10-CM

## 2013-01-04 DIAGNOSIS — K921 Melena: Secondary | ICD-10-CM

## 2013-01-04 DIAGNOSIS — K648 Other hemorrhoids: Secondary | ICD-10-CM

## 2013-01-04 LAB — CBC WITH DIFFERENTIAL/PLATELET
Basophils Relative: 0.6 % (ref 0.0–3.0)
Hemoglobin: 13.1 g/dL (ref 12.0–15.0)
Lymphocytes Relative: 29.8 % (ref 12.0–46.0)
Monocytes Relative: 15.5 % — ABNORMAL HIGH (ref 3.0–12.0)
Neutro Abs: 3 10*3/uL (ref 1.4–7.7)
RBC: 4.38 Mil/uL (ref 3.87–5.11)
WBC: 6.4 10*3/uL (ref 4.5–10.5)

## 2013-01-04 MED ORDER — HYDROCORTISONE ACETATE 25 MG RE SUPP: 25.0000 mg | Freq: Every day | RECTAL | Status: AC

## 2013-01-04 NOTE — Progress Notes (Signed)
Regina Williams 1925/03/19 MRN [id number]        History of Present Illness:  This is 79 year-old white female with history of internal hemorrhoids and intermittent painless rectal bleeding. She was evaluated for hematuria by Dr Conni Slipper  in October 2013 and a CT scan of the abdomen showed multiple small low attenuation lesions in the liver consistent with cysts. We have done a colonoscopy in 2003 when she was found to have internal hemorrhoids and again in October 2007 when she was constipated and again had colonoscopy but no polyps were found. She has regular bowel habits. She has a history of stage II breast cancer and has a history of cystocele   Past Medical History  Diagnosis Date  . Anxiety   . Breast cancer 1994    right  . Hyperlipidemia   . Hypertension   . Osteoporosis   . Dry eyes   . Cystocele   . Internal hemorrhoids   . Liver lesion 2013    multiple   Past Surgical History  Procedure Laterality Date  . Cholecystectomy    . Abdominal hysterectomy    . Mastectomy      reports that she has never smoked. She has never used smokeless tobacco. She reports that she does not drink alcohol or use illicit drugs. family history includes Heart disease (age of onset: 60) in her mother.  There is no history of Colon cancer. No Known Allergies      Review of Systems: Denies  abdominal pain, weight loss diarrhea  The remainder of the 10 point ROS is negative except as outlined in H&P   Physical Exam: General appearance  Well developed, in no distress. Eyes- non icteric. HEENT nontraumatic, normocephalic. Mouth no lesions, tongue papillated, no cheilosis. Neck supple without adenopathy, thyroid not enlarged, no carotid bruits, no JVD. Lungs Clear to auscultation bilaterally. Cor normal S1, normal S2, regular rhythm, no murmur,  quiet precordium. Abdomen: Soft nontender abdomen with normal active bowel sounds. No distention. No scars. Liver edge at costal  margin Rectal: And anoscopic exam reveals a normal perianal area with minimal  skin tags. Slightly decreased rectal sphincter tone. Several small first-grade hemorrhoids which did not prolapse. There were edematous but there was no bleeding. Stool was Hemoccult-positive Extremities no pedal edema. Skin no lesions. Neurological alert and oriented x 3. Psychological normal mood and affect.  Assessment and Plan:  79 year old white female with intermittent painless low-volume rectal bleeding which is likely related to internal hemorrhoids. Today her stool was Hemoccult positive. At her age she is at high risk for colonoscopy. Will therefore treat the hemorrhoids with Anusol-HC suppositories and a repeat stool Hemoccult cards afterwards and if they are negative then she will not need a colonoscopy. We will also check her CBC today   01/04/2013 Lina Sar

## 2013-01-04 NOTE — Patient Instructions (Addendum)
Your physician has requested that you go to the basement for the following lab work before leaving today: CBC.  We have sent the following medications to your pharmacy for you to pick up at your convenience: Anusol suppositories.  Please follow instructions on Hemoccult cards and mail them back to [place] when finished.  You have been given a brochure on Hemorrhoids. Dr Posey Rea

## 2013-01-09 ENCOUNTER — Encounter: Payer: Self-pay | Admitting: Cardiovascular Disease

## 2013-01-24 ENCOUNTER — Ambulatory Visit (INDEPENDENT_AMBULATORY_CARE_PROVIDER_SITE_OTHER): Payer: Medicare HMO | Admitting: Internal Medicine

## 2013-01-24 ENCOUNTER — Encounter: Payer: Self-pay | Admitting: Internal Medicine

## 2013-01-24 VITALS — BP 128/72 | HR 72 | Resp 16 | Wt 108.0 lb

## 2013-01-24 DIAGNOSIS — M25561 Pain in right knee: Secondary | ICD-10-CM

## 2013-01-24 DIAGNOSIS — F4323 Adjustment disorder with mixed anxiety and depressed mood: Secondary | ICD-10-CM

## 2013-01-24 DIAGNOSIS — M25569 Pain in unspecified knee: Secondary | ICD-10-CM

## 2013-01-24 DIAGNOSIS — J309 Allergic rhinitis, unspecified: Secondary | ICD-10-CM

## 2013-01-24 MED ORDER — FLUOXETINE HCL 10 MG PO TABS: 5.0000 mg | ORAL_TABLET | Freq: Every day | ORAL | Status: DC

## 2013-01-24 NOTE — Assessment & Plan Note (Signed)
Continue with current prescription therapy as reflected on the Med list.  

## 2013-01-24 NOTE — Assessment & Plan Note (Signed)
Better  

## 2013-01-24 NOTE — Assessment & Plan Note (Signed)
Will try a lo dose Prozac

## 2013-01-24 NOTE — Progress Notes (Signed)
Subjective:    HPI  Pt with soft tissue injury with swelling and bruising to medial thigh after she forgot to put brake on and she tried to stop it from rolling on 4/28. C/o L thigh pain from hematoma. No fx.  C/o  R knee pain - better   F/u PVD, anxiety, osteoporosis.  C/o stress   BP Readings from Last 3 Encounters:  01/24/13 128/72  01/04/13 100/60  12/26/12 150/72   Wt Readings from Last 3 Encounters:  01/24/13 108 lb (48.988 kg)  01/04/13 107 lb 6 oz (48.705 kg)  12/26/12 107 lb (48.535 kg)      Review of Systems  Constitutional: Negative for fever, chills, diaphoresis, activity change, appetite change, fatigue and unexpected weight change.  HENT: Negative for hearing loss, ear pain, congestion, sore throat, sneezing, mouth sores, neck pain, dental problem, voice change, postnasal drip and sinus pressure.   Eyes: Negative for pain and visual disturbance.  Respiratory: Negative for cough, chest tightness, wheezing and stridor.   Cardiovascular: Negative for chest pain, palpitations and leg swelling.  Gastrointestinal: Negative for nausea, vomiting, abdominal pain, blood in stool, abdominal distention and rectal pain.  Genitourinary: Negative for dysuria, hematuria, decreased urine volume, vaginal bleeding, vaginal discharge, difficulty urinating, vaginal pain and menstrual problem.  Musculoskeletal: Negative for back pain, joint swelling and gait problem.  Skin: Negative for color change, rash and wound.  Neurological: Negative for dizziness, tremors, syncope, speech difficulty and light-headedness.  Hematological: Negative for adenopathy.  Psychiatric/Behavioral: Positive for sleep disturbance (sometimes) and decreased concentration. Negative for suicidal ideas, hallucinations, behavioral problems, confusion and dysphoric mood. The patient is nervous/anxious. The patient is not hyperactive.   L inner thigh w/hyperpigmentation, sensitive     Objective:   Physical  Exam  Constitutional: She appears well-developed and well-nourished. No distress.  HENT:  Head: Normocephalic.  Right Ear: External ear normal.  Left Ear: External ear normal.  Nose: Nose normal.  Mouth/Throat: Oropharynx is clear and moist.  Eyes: Conjunctivae are normal. Pupils are equal, round, and reactive to light. Right eye exhibits no discharge. Left eye exhibits no discharge.  Neck: Normal range of motion. Neck supple. No JVD present. No tracheal deviation present. No thyromegaly present.  Cardiovascular: Normal rate, regular rhythm and normal heart sounds.   Pulmonary/Chest: No stridor. No respiratory distress. She has no wheezes.  Abdominal: Soft. Bowel sounds are normal. She exhibits no distension and no mass. There is no tenderness. There is no rebound and no guarding.  Musculoskeletal: She exhibits no edema and no tenderness.  Lymphadenopathy:    She has no cervical adenopathy.  Neurological: She displays normal reflexes. No cranial nerve deficit. She exhibits normal muscle tone. Coordination normal.  Skin: No rash noted. No erythema.  Psychiatric: She has a normal mood and affect. Her behavior is normal. Judgment and thought content normal.  L thigh hematoma is much better R knee is not tender w/ROM Sad a little    Lab Results  Component Value Date   WBC 6.4 01/04/2013   HGB 13.1 01/04/2013   HCT 38.8 01/04/2013   PLT 231.0 01/04/2013   GLUCOSE 103* 11/19/2012   CHOL 177 04/19/2012   TRIG 60.0 04/19/2012   HDL 65.10 04/19/2012   LDLCALC 100* 04/19/2012   ALT 20 04/19/2012   AST 22 04/19/2012   NA 142 11/19/2012   K 3.9 11/19/2012   CL 105 11/19/2012   CREATININE 0.70 11/19/2012   BUN 29* 11/19/2012   CO2 32 06/11/2012  TSH 3.38 04/19/2012      Assessment & Plan:

## 2013-01-30 ENCOUNTER — Other Ambulatory Visit (INDEPENDENT_AMBULATORY_CARE_PROVIDER_SITE_OTHER): Payer: Medicare HMO

## 2013-01-30 DIAGNOSIS — K921 Melena: Secondary | ICD-10-CM

## 2013-01-30 LAB — HEMOCCULT SLIDES (X 3 CARDS)
Fecal Occult Blood: NEGATIVE
OCCULT 3: NEGATIVE
OCCULT 4: NEGATIVE

## 2013-04-09 ENCOUNTER — Encounter: Payer: Self-pay | Admitting: Internal Medicine

## 2013-04-29 ENCOUNTER — Ambulatory Visit (INDEPENDENT_AMBULATORY_CARE_PROVIDER_SITE_OTHER): Payer: Medicare HMO | Admitting: Internal Medicine

## 2013-04-29 ENCOUNTER — Encounter: Payer: Self-pay | Admitting: Internal Medicine

## 2013-04-29 VITALS — BP 128/72 | HR 82 | Temp 96.9°F | Resp 12 | Wt 104.0 lb

## 2013-04-29 DIAGNOSIS — S7010XA Contusion of unspecified thigh, initial encounter: Secondary | ICD-10-CM

## 2013-04-29 DIAGNOSIS — I739 Peripheral vascular disease, unspecified: Secondary | ICD-10-CM

## 2013-04-29 DIAGNOSIS — F411 Generalized anxiety disorder: Secondary | ICD-10-CM

## 2013-04-29 DIAGNOSIS — S7012XA Contusion of left thigh, initial encounter: Secondary | ICD-10-CM

## 2013-04-29 DIAGNOSIS — M25512 Pain in left shoulder: Secondary | ICD-10-CM

## 2013-04-29 DIAGNOSIS — M25519 Pain in unspecified shoulder: Secondary | ICD-10-CM

## 2013-04-29 NOTE — Assessment & Plan Note (Signed)
Continue with current prescription therapy as reflected on the Med list.  

## 2013-04-29 NOTE — Assessment & Plan Note (Signed)
Better  

## 2013-04-29 NOTE — Progress Notes (Signed)
Patient ID: Regina Williams, female   DOB: [birth year], 79 y.o.   MRN: [id number]   Subjective:    HPI  Pt with soft tissue injury with swelling and bruising to medial thigh after she forgot to put brake on and she tried to stop it from rolling on 11/19/12. F/u L thigh  Hematoma - resolved. No fx.  C/o  R knee pain - better   F/u PVD, anxiety, osteoporosis.  C/o stress   BP Readings from Last 3 Encounters:  04/29/13 128/72  01/24/13 128/72  01/04/13 100/60   Wt Readings from Last 3 Encounters:  04/29/13 104 lb (47.174 kg)  01/24/13 108 lb (48.988 kg)  01/04/13 107 lb 6 oz (48.705 kg)      Review of Systems  Constitutional: Negative for fever, chills, diaphoresis, activity change, appetite change, fatigue and unexpected weight change.  HENT: Negative for hearing loss, ear pain, congestion, sore throat, sneezing, mouth sores, neck pain, dental problem, voice change, postnasal drip and sinus pressure.   Eyes: Negative for pain and visual disturbance.  Respiratory: Negative for cough, chest tightness, wheezing and stridor.   Cardiovascular: Negative for chest pain, palpitations and leg swelling.  Gastrointestinal: Negative for nausea, vomiting, abdominal pain, blood in stool, abdominal distention and rectal pain.  Genitourinary: Negative for dysuria, hematuria, decreased urine volume, vaginal bleeding, vaginal discharge, difficulty urinating, vaginal pain and menstrual problem.  Musculoskeletal: Negative for back pain, joint swelling and gait problem.  Skin: Negative for color change, rash and wound.  Neurological: Negative for dizziness, tremors, syncope, speech difficulty and light-headedness.  Hematological: Negative for adenopathy.  Psychiatric/Behavioral: Positive for sleep disturbance (sometimes) and decreased concentration. Negative for suicidal ideas, hallucinations, behavioral problems, confusion and dysphoric mood. The patient is nervous/anxious. The patient is not  hyperactive.   L inner thigh w/hyperpigmentation, sensitive     Objective:   Physical Exam  Constitutional: She appears well-developed and well-nourished. No distress.  HENT:  Head: Normocephalic.  Right Ear: External ear normal.  Left Ear: External ear normal.  Nose: Nose normal.  Mouth/Throat: Oropharynx is clear and moist.  Eyes: Conjunctivae are normal. Pupils are equal, round, and reactive to light. Right eye exhibits no discharge. Left eye exhibits no discharge.  Neck: Normal range of motion. Neck supple. No JVD present. No tracheal deviation present. No thyromegaly present.  Cardiovascular: Normal rate, regular rhythm and normal heart sounds.   Pulmonary/Chest: No stridor. No respiratory distress. She has no wheezes.  Abdominal: Soft. Bowel sounds are normal. She exhibits no distension and no mass. There is no tenderness. There is no rebound and no guarding.  Musculoskeletal: She exhibits no edema and no tenderness.  Lymphadenopathy:    She has no cervical adenopathy.  Neurological: She displays normal reflexes. No cranial nerve deficit. She exhibits normal muscle tone. Coordination normal.  Skin: No rash noted. No erythema.  Psychiatric: She has a normal mood and affect. Her behavior is normal. Judgment and thought content normal.  L thigh hematoma is gone, hyperpigmented skin R knee is not tender w/ROM Sad a little    Lab Results  Component Value Date   WBC 6.4 01/04/2013   HGB 13.1 01/04/2013   HCT 38.8 01/04/2013   PLT 231.0 01/04/2013   GLUCOSE 103* 11/19/2012   CHOL 177 04/19/2012   TRIG 60.0 04/19/2012   HDL 65.10 04/19/2012   LDLCALC 100* 04/19/2012   ALT 20 04/19/2012   AST 22 04/19/2012   NA 142 11/19/2012   K 3.9 11/19/2012  CL 105 11/19/2012   CREATININE 0.70 11/19/2012   BUN 29* 11/19/2012   CO2 32 06/11/2012   TSH 3.38 04/19/2012      Assessment & Plan:

## 2013-04-29 NOTE — Assessment & Plan Note (Signed)
Resolved

## 2013-06-04 ENCOUNTER — Other Ambulatory Visit: Payer: Self-pay | Admitting: Internal Medicine

## 2013-07-02 ENCOUNTER — Other Ambulatory Visit: Payer: Self-pay | Admitting: Internal Medicine

## 2013-07-09 ENCOUNTER — Encounter: Payer: Self-pay | Admitting: Internal Medicine

## 2013-07-09 ENCOUNTER — Other Ambulatory Visit: Payer: Self-pay | Admitting: Internal Medicine

## 2013-07-09 ENCOUNTER — Ambulatory Visit (INDEPENDENT_AMBULATORY_CARE_PROVIDER_SITE_OTHER): Payer: Medicare HMO | Admitting: Internal Medicine

## 2013-07-09 ENCOUNTER — Other Ambulatory Visit (INDEPENDENT_AMBULATORY_CARE_PROVIDER_SITE_OTHER): Payer: Medicare HMO

## 2013-07-09 VITALS — BP 124/62 | HR 68 | Temp 96.8°F | Resp 16 | Ht 59.75 in | Wt 102.0 lb

## 2013-07-09 DIAGNOSIS — F411 Generalized anxiety disorder: Secondary | ICD-10-CM

## 2013-07-09 DIAGNOSIS — I10 Essential (primary) hypertension: Secondary | ICD-10-CM

## 2013-07-09 DIAGNOSIS — Z23 Encounter for immunization: Secondary | ICD-10-CM

## 2013-07-09 DIAGNOSIS — Z0000 Encounter for general adult medical examination without abnormal findings: Secondary | ICD-10-CM

## 2013-07-09 DIAGNOSIS — E785 Hyperlipidemia, unspecified: Secondary | ICD-10-CM

## 2013-07-09 DIAGNOSIS — J309 Allergic rhinitis, unspecified: Secondary | ICD-10-CM

## 2013-07-09 LAB — HEPATIC FUNCTION PANEL
AST: 23 U/L (ref 0–37)
Bilirubin, Direct: 0.1 mg/dL (ref 0.0–0.3)
Total Bilirubin: 0.8 mg/dL (ref 0.3–1.2)

## 2013-07-09 LAB — LIPID PANEL
Cholesterol: 189 mg/dL (ref 0–200)
HDL: 81.8 mg/dL (ref >39.00)
LDL Cholesterol: 92 mg/dL (ref 0–99)
Total CHOL/HDL Ratio: 2
Triglycerides: 78 mg/dL (ref 0.0–149.0)
VLDL: 15.6 mg/dL (ref 0.0–40.0)

## 2013-07-09 LAB — BASIC METABOLIC PANEL
BUN: 29 mg/dL — ABNORMAL HIGH (ref 6–23)
Calcium: 10.1 mg/dL (ref 8.4–10.5)
Creatinine, Ser: 0.7 mg/dL (ref 0.4–1.2)
GFR: 91.83 mL/min (ref >60.00)
Glucose, Bld: 91 mg/dL (ref 70–99)
Potassium: 4 mEq/L (ref 3.5–5.1)
Sodium: 139 mEq/L (ref 135–145)

## 2013-07-09 LAB — CBC WITH DIFFERENTIAL/PLATELET
Basophils Absolute: 0 10*3/uL (ref 0.0–0.1)
Eosinophils Relative: 2.6 % (ref 0.0–5.0)
HCT: 43.4 % (ref 36.0–46.0)
Lymphs Abs: 1.6 10*3/uL (ref 0.7–4.0)
MCV: 87.6 fl (ref 78.0–100.0)
Monocytes Absolute: 0.9 10*3/uL (ref 0.1–1.0)
Monocytes Relative: 14.1 % — ABNORMAL HIGH (ref 3.0–12.0)
Neutrophils Relative %: 57.7 % (ref 43.0–77.0)
Platelets: 256 10*3/uL (ref 150.0–400.0)
RBC: 4.95 Mil/uL (ref 3.87–5.11)
RDW: 13.2 % (ref 11.5–14.6)
WBC: 6.3 10*3/uL (ref 4.5–10.5)

## 2013-07-09 LAB — URINALYSIS, ROUTINE W REFLEX MICROSCOPIC
Bilirubin Urine: NEGATIVE
Urine Glucose: NEGATIVE
Urobilinogen, UA: 0.2 (ref 0.0–1.0)

## 2013-07-09 MED ORDER — LORAZEPAM 1 MG PO TABS: ORAL_TABLET | ORAL | Status: DC

## 2013-07-09 MED ORDER — CIPROFLOXACIN HCL 250 MG PO TABS: 250.0000 mg | ORAL_TABLET | Freq: Two times a day (BID) | ORAL | Status: DC

## 2013-07-09 MED ORDER — FLUOXETINE HCL 10 MG PO TABS: 5.0000 mg | ORAL_TABLET | Freq: Every day | ORAL | Status: DC

## 2013-07-09 NOTE — Assessment & Plan Note (Signed)
Continue with current prescription therapy as reflected on the Med list.  

## 2013-07-09 NOTE — Progress Notes (Signed)
Pre visit review using our clinic review tool, if applicable. No additional management support is needed unless otherwise documented below in the visit note. 

## 2013-07-09 NOTE — Addendum Note (Signed)
Addended by: Merrilyn Puma on: 07/09/2013 04:57 PM   Modules accepted: Orders

## 2013-07-09 NOTE — Assessment & Plan Note (Signed)

## 2013-07-09 NOTE — Progress Notes (Signed)
   Subjective:    HPI  The patient is here for a wellness exam.      F/u PVD, anxiety, osteoporosis.  C/o wt los, sadness, friend died   BP Readings from Last 3 Encounters:  07/09/13 124/62  04/29/13 128/72  01/24/13 128/72   Wt Readings from Last 3 Encounters:  07/09/13 102 lb (46.267 kg)  04/29/13 104 lb (47.174 kg)  01/24/13 108 lb (48.988 kg)      Review of Systems  Constitutional: Negative for fever, chills, diaphoresis, activity change, appetite change, fatigue and unexpected weight change.  HENT: Negative for congestion, dental problem, ear pain, hearing loss, mouth sores, postnasal drip, sinus pressure, sneezing, sore throat and voice change.   Eyes: Negative for pain and visual disturbance.  Respiratory: Negative for cough, chest tightness, wheezing and stridor.   Cardiovascular: Negative for chest pain, palpitations and leg swelling.  Gastrointestinal: Negative for nausea, vomiting, abdominal pain, blood in stool, abdominal distention and rectal pain.  Genitourinary: Negative for dysuria, hematuria, decreased urine volume, vaginal bleeding, vaginal discharge, difficulty urinating, vaginal pain and menstrual problem.  Musculoskeletal: Negative for back pain, gait problem, joint swelling and neck pain.  Skin: Negative for color change, rash and wound.  Neurological: Negative for dizziness, tremors, syncope, speech difficulty and light-headedness.  Hematological: Negative for adenopathy.  Psychiatric/Behavioral: Positive for sleep disturbance (sometimes) and decreased concentration. Negative for suicidal ideas, hallucinations, behavioral problems, confusion and dysphoric mood. The patient is nervous/anxious. The patient is not hyperactive.        Objective:   Physical Exam  Constitutional: She appears well-developed and well-nourished. No distress.  HENT:  Head: Normocephalic.  Right Ear: External ear normal.  Left Ear: External ear normal.  Nose: Nose  normal.  Mouth/Throat: Oropharynx is clear and moist.  Eyes: Conjunctivae are normal. Pupils are equal, round, and reactive to light. Right eye exhibits no discharge. Left eye exhibits no discharge.  Neck: Normal range of motion. Neck supple. No JVD present. No tracheal deviation present. No thyromegaly present.  Cardiovascular: Normal rate, regular rhythm and normal heart sounds.   Pulmonary/Chest: No stridor. No respiratory distress. She has no wheezes.  Abdominal: Soft. Bowel sounds are normal. She exhibits no distension and no mass. There is no tenderness. There is no rebound and no guarding.  Musculoskeletal: She exhibits no edema and no tenderness.  Lymphadenopathy:    She has no cervical adenopathy.  Neurological: She displays normal reflexes. No cranial nerve deficit. She exhibits normal muscle tone. Coordination normal.  Skin: No rash noted. No erythema.  Psychiatric: She has a normal mood and affect. Her behavior is normal. Judgment and thought content normal.  same LLE changes Sad a little    Lab Results  Component Value Date   WBC 6.4 01/04/2013   HGB 13.1 01/04/2013   HCT 38.8 01/04/2013   PLT 231.0 01/04/2013   GLUCOSE 103* 11/19/2012   CHOL 177 04/19/2012   TRIG 60.0 04/19/2012   HDL 65.10 04/19/2012   LDLCALC 100* 04/19/2012   ALT 20 04/19/2012   AST 22 04/19/2012   NA 142 11/19/2012   K 3.9 11/19/2012   CL 105 11/19/2012   CREATININE 0.70 11/19/2012   BUN 29* 11/19/2012   CO2 32 06/11/2012   TSH 3.38 04/19/2012      Assessment & Plan:

## 2013-08-05 ENCOUNTER — Telehealth: Payer: Self-pay | Admitting: *Deleted

## 2013-08-05 NOTE — Telephone Encounter (Signed)
I called pt- she had questions about a letter from [employer] screening she received stating she should consider a carotid US, and heart rhythm testing and other U/S testing and bone density testing. Please advise. She is scheduled to see PCP 09/27/13. These [facility]. She wants MD's opinion if she should have test. Please advise.

## 2013-08-05 NOTE — Telephone Encounter (Signed)
It is a good value if <$100 Thx

## 2013-08-05 NOTE — Telephone Encounter (Signed)
States she was returning [employer] phone call.  No further information given.  CB# [phone]

## 2013-08-06 NOTE — Telephone Encounter (Signed)
Pt informed

## 2013-09-27 ENCOUNTER — Ambulatory Visit (INDEPENDENT_AMBULATORY_CARE_PROVIDER_SITE_OTHER): Payer: Medicare HMO | Admitting: Internal Medicine

## 2013-09-27 ENCOUNTER — Encounter: Payer: Self-pay | Admitting: Internal Medicine

## 2013-09-27 VITALS — BP 140/85 | HR 80 | Temp 97.0°F | Resp 16 | Wt 103.0 lb

## 2013-09-27 DIAGNOSIS — I10 Essential (primary) hypertension: Secondary | ICD-10-CM

## 2013-09-27 DIAGNOSIS — F411 Generalized anxiety disorder: Secondary | ICD-10-CM

## 2013-09-27 DIAGNOSIS — S7010XA Contusion of unspecified thigh, initial encounter: Secondary | ICD-10-CM

## 2013-09-27 DIAGNOSIS — S7012XA Contusion of left thigh, initial encounter: Secondary | ICD-10-CM

## 2013-09-27 DIAGNOSIS — F0390 Unspecified dementia without behavioral disturbance: Secondary | ICD-10-CM

## 2013-09-27 MED ORDER — DONEPEZIL HCL 5 MG PO TABS: 5.0000 mg | ORAL_TABLET | Freq: Every day | ORAL | Status: DC

## 2013-09-27 NOTE — Progress Notes (Signed)
Pre visit review using our clinic review tool, if applicable. No additional management support is needed unless otherwise documented below in the visit note. 

## 2013-10-02 ENCOUNTER — Encounter: Payer: Self-pay | Admitting: Internal Medicine

## 2013-10-02 DIAGNOSIS — F0390 Unspecified dementia without behavioral disturbance: Secondary | ICD-10-CM | POA: Insufficient documentation

## 2013-10-02 NOTE — Progress Notes (Signed)
   Subjective:    HPI  F/u PVD, anxiety, osteoporosis.  C/o memory issues - worse. Pt came w/her dtr F/u wt los, sadness, friend died - better   BP Readings from Last 3 Encounters:  09/27/13 140/85  07/09/13 124/62  04/29/13 128/72   Wt Readings from Last 3 Encounters:  09/27/13 103 lb (46.72 kg)  07/09/13 102 lb (46.267 kg)  04/29/13 104 lb (47.174 kg)      Review of Systems  Constitutional: Negative for fever, chills, diaphoresis, activity change, appetite change, fatigue and unexpected weight change.  HENT: Negative for congestion, dental problem, ear pain, hearing loss, mouth sores, postnasal drip, sinus pressure, sneezing, sore throat and voice change.   Eyes: Negative for pain and visual disturbance.  Respiratory: Negative for cough, chest tightness, wheezing and stridor.   Cardiovascular: Negative for chest pain, palpitations and leg swelling.  Gastrointestinal: Negative for nausea, vomiting, abdominal pain, blood in stool, abdominal distention and rectal pain.  Genitourinary: Negative for dysuria, hematuria, decreased urine volume, vaginal bleeding, vaginal discharge, difficulty urinating, vaginal pain and menstrual problem.  Musculoskeletal: Negative for back pain, gait problem, joint swelling and neck pain.  Skin: Negative for color change, rash and wound.  Neurological: Negative for dizziness, tremors, syncope, speech difficulty and light-headedness.  Hematological: Negative for adenopathy.  Psychiatric/Behavioral: Positive for sleep disturbance (sometimes) and decreased concentration. Negative for suicidal ideas, hallucinations, behavioral problems, confusion and dysphoric mood. The patient is nervous/anxious. The patient is not hyperactive.        Objective:   Physical Exam  Constitutional: She appears well-developed and well-nourished. No distress.  HENT:  Head: Normocephalic.  Right Ear: External ear normal.  Left Ear: External ear normal.  Nose: Nose  normal.  Mouth/Throat: Oropharynx is clear and moist.  Eyes: Conjunctivae are normal. Pupils are equal, round, and reactive to light. Right eye exhibits no discharge. Left eye exhibits no discharge.  Neck: Normal range of motion. Neck supple. No JVD present. No tracheal deviation present. No thyromegaly present.  Cardiovascular: Normal rate, regular rhythm and normal heart sounds.   Pulmonary/Chest: No stridor. No respiratory distress. She has no wheezes.  Abdominal: Soft. Bowel sounds are normal. She exhibits no distension and no mass. There is no tenderness. There is no rebound and no guarding.  Musculoskeletal: She exhibits no edema and no tenderness.  Lymphadenopathy:    She has no cervical adenopathy.  Neurological: She displays normal reflexes. No cranial nerve deficit. She exhibits normal muscle tone. Coordination normal.  Skin: No rash noted. No erythema.  Psychiatric: She has a normal mood and affect. Her behavior is normal. Judgment and thought content normal.  same LLE changes Sad a little MMS - decreased     Lab Results  Component Value Date   WBC 6.3 07/09/2013   HGB 14.7 07/09/2013   HCT 43.4 07/09/2013   PLT 256.0 07/09/2013   GLUCOSE 91 07/09/2013   CHOL 189 07/09/2013   TRIG 78.0 07/09/2013   HDL 81.80 07/09/2013   LDLCALC 92 07/09/2013   ALT 16 07/09/2013   AST 23 07/09/2013   NA 139 07/09/2013   K 4.0 07/09/2013   CL 102 07/09/2013   CREATININE 0.7 07/09/2013   BUN 29* 07/09/2013   CO2 29 07/09/2013   TSH 1.58 07/09/2013      Assessment & Plan:

## 2013-10-02 NOTE — Assessment & Plan Note (Signed)
Continue with current prescription therapy as reflected on the Med list.  

## 2013-10-02 NOTE — Assessment & Plan Note (Signed)
Resolved

## 2013-10-02 NOTE — Assessment & Plan Note (Signed)
Discussed w/pt and dtr Start Aricept

## 2013-11-11 ENCOUNTER — Telehealth: Payer: Self-pay

## 2013-11-11 MED ORDER — FLUOXETINE HCL 10 MG PO TABS: 5.0000 mg | ORAL_TABLET | Freq: Every day | ORAL | Status: DC

## 2013-11-11 NOTE — Telephone Encounter (Signed)
Done

## 2013-11-11 NOTE — Telephone Encounter (Signed)
The patient called and is hoping to get a new rx of Prozac called into her pharmacy.  She states she was on this medication, and now wants to re-start it.   Thanks!

## 2013-11-23 ENCOUNTER — Other Ambulatory Visit: Payer: Self-pay | Admitting: Internal Medicine

## 2013-11-27 ENCOUNTER — Ambulatory Visit (INDEPENDENT_AMBULATORY_CARE_PROVIDER_SITE_OTHER): Payer: Medicare HMO | Admitting: Internal Medicine

## 2013-11-27 ENCOUNTER — Telehealth: Payer: Self-pay | Admitting: Internal Medicine

## 2013-11-27 ENCOUNTER — Encounter: Payer: Self-pay | Admitting: Internal Medicine

## 2013-11-27 VITALS — BP 136/52 | HR 72 | Temp 99.2°F | Resp 16 | Wt 102.0 lb

## 2013-11-27 DIAGNOSIS — E785 Hyperlipidemia, unspecified: Secondary | ICD-10-CM

## 2013-11-27 DIAGNOSIS — I10 Essential (primary) hypertension: Secondary | ICD-10-CM

## 2013-11-27 DIAGNOSIS — F0390 Unspecified dementia without behavioral disturbance: Secondary | ICD-10-CM

## 2013-11-27 MED ORDER — DONEPEZIL HCL 10 MG PO TABS
10.0000 mg | ORAL_TABLET | Freq: Every day | ORAL | Status: DC
Start: 2013-11-27 — End: 2014-12-06

## 2013-11-27 MED ORDER — LOVASTATIN 20 MG PO TABS: ORAL_TABLET | ORAL | Status: DC

## 2013-11-27 NOTE — Assessment & Plan Note (Signed)
Will increase Aricept to 10 mg/d if tolerated

## 2013-11-27 NOTE — Progress Notes (Signed)
   Subjective:    HPI  F/u PVD, anxiety, osteoporosis.  C/o memory issues - not worse. Feeling great.  Pt came w/her dtr again F/u wt loss   BP Readings from Last 3 Encounters:  11/27/13 136/52  09/27/13 140/85  07/09/13 124/62   Wt Readings from Last 3 Encounters:  11/27/13 102 lb (46.267 kg)  09/27/13 103 lb (46.72 kg)  07/09/13 102 lb (46.267 kg)      Review of Systems  Constitutional: Negative for fever, chills, diaphoresis, activity change, appetite change, fatigue and unexpected weight change.  HENT: Negative for congestion, dental problem, ear pain, hearing loss, mouth sores, postnasal drip, sinus pressure, sneezing, sore throat and voice change.   Eyes: Negative for pain and visual disturbance.  Respiratory: Negative for cough, chest tightness, wheezing and stridor.   Cardiovascular: Negative for chest pain, palpitations and leg swelling.  Gastrointestinal: Negative for nausea, vomiting, abdominal pain, blood in stool, abdominal distention and rectal pain.  Genitourinary: Negative for dysuria, hematuria, decreased urine volume, vaginal bleeding, vaginal discharge, difficulty urinating, vaginal pain and menstrual problem.  Musculoskeletal: Negative for back pain, gait problem, joint swelling and neck pain.  Skin: Negative for color change, rash and wound.  Neurological: Negative for dizziness, tremors, syncope, speech difficulty and light-headedness.  Hematological: Negative for adenopathy.  Psychiatric/Behavioral: Positive for sleep disturbance (sometimes) and decreased concentration. Negative for suicidal ideas, hallucinations, behavioral problems, confusion and dysphoric mood. The patient is nervous/anxious. The patient is not hyperactive.        Objective:   Physical Exam  Constitutional: She appears well-developed and well-nourished. No distress.  HENT:  Head: Normocephalic.  Right Ear: External ear normal.  Left Ear: External ear normal.  Nose: Nose  normal.  Mouth/Throat: Oropharynx is clear and moist.  Eyes: Conjunctivae are normal. Pupils are equal, round, and reactive to light. Right eye exhibits no discharge. Left eye exhibits no discharge.  Neck: Normal range of motion. Neck supple. No JVD present. No tracheal deviation present. No thyromegaly present.  Cardiovascular: Normal rate, regular rhythm and normal heart sounds.   Pulmonary/Chest: No stridor. No respiratory distress. She has no wheezes.  Abdominal: Soft. Bowel sounds are normal. She exhibits no distension and no mass. There is no tenderness. There is no rebound and no guarding.  Musculoskeletal: She exhibits no edema and no tenderness.  Lymphadenopathy:    She has no cervical adenopathy.  Neurological: She displays normal reflexes. No cranial nerve deficit. She exhibits normal muscle tone. Coordination normal.  Skin: No rash noted. No erythema.  Psychiatric: She has a normal mood and affect. Her behavior is normal. Judgment and thought content normal.  same LLE changes Happy MMS - decreased     Lab Results  Component Value Date   WBC 6.3 07/09/2013   HGB 14.7 07/09/2013   HCT 43.4 07/09/2013   PLT 256.0 07/09/2013   GLUCOSE 91 07/09/2013   CHOL 189 07/09/2013   TRIG 78.0 07/09/2013   HDL 81.80 07/09/2013   LDLCALC 92 07/09/2013   ALT 16 07/09/2013   AST 23 07/09/2013   NA 139 07/09/2013   K 4.0 07/09/2013   CL 102 07/09/2013   CREATININE 0.7 07/09/2013   BUN 29* 07/09/2013   CO2 29 07/09/2013   TSH 1.58 07/09/2013      Assessment & Plan:

## 2013-11-27 NOTE — Assessment & Plan Note (Signed)
Continue with current prescription therapy as reflected on the Med list.  

## 2013-11-27 NOTE — Telephone Encounter (Signed)
Relevant patient education assigned to patient using Emmi. ° °

## 2013-11-27 NOTE — Progress Notes (Signed)
Pre visit review using our clinic review tool, if applicable. No additional management support is needed unless otherwise documented below in the visit note. 

## 2014-02-25 ENCOUNTER — Other Ambulatory Visit: Payer: Self-pay | Admitting: Internal Medicine

## 2014-02-26 ENCOUNTER — Ambulatory Visit (INDEPENDENT_AMBULATORY_CARE_PROVIDER_SITE_OTHER): Payer: Medicare HMO | Admitting: Internal Medicine

## 2014-02-26 ENCOUNTER — Encounter: Payer: Self-pay | Admitting: Internal Medicine

## 2014-02-26 ENCOUNTER — Ambulatory Visit: Payer: Medicare HMO | Admitting: *Deleted

## 2014-02-26 VITALS — BP 111/58 | HR 64 | Temp 97.6°F | Wt 102.0 lb

## 2014-02-26 DIAGNOSIS — F4323 Adjustment disorder with mixed anxiety and depressed mood: Secondary | ICD-10-CM

## 2014-02-26 DIAGNOSIS — I10 Essential (primary) hypertension: Secondary | ICD-10-CM

## 2014-02-26 DIAGNOSIS — F411 Generalized anxiety disorder: Secondary | ICD-10-CM

## 2014-02-26 DIAGNOSIS — F0390 Unspecified dementia without behavioral disturbance: Secondary | ICD-10-CM

## 2014-02-26 DIAGNOSIS — Z0000 Encounter for general adult medical examination without abnormal findings: Secondary | ICD-10-CM

## 2014-02-26 LAB — URINALYSIS, ROUTINE W REFLEX MICROSCOPIC
Bilirubin Urine: NEGATIVE
Ketones, ur: NEGATIVE
NITRITE: NEGATIVE
Specific Gravity, Urine: 1.01 (ref 1.000–1.030)
TOTAL PROTEIN, URINE-UPE24: NEGATIVE
UROBILINOGEN UA: 0.2 (ref 0.0–1.0)
Urine Glucose: NEGATIVE
pH: 6 (ref 5.0–8.0)

## 2014-02-26 MED ORDER — LORAZEPAM 0.5 MG PO TABS: 0.5000 mg | ORAL_TABLET | Freq: Two times a day (BID) | ORAL | Status: DC | PRN

## 2014-02-26 NOTE — Assessment & Plan Note (Signed)
We will reduce Lorazepam dose - sleepy at times

## 2014-02-26 NOTE — Assessment & Plan Note (Signed)
Continue with current prescription therapy as reflected on the Med list.  

## 2014-02-26 NOTE — Progress Notes (Signed)
   Subjective:    HPI  F/u PVD, anxiety, osteoporosis.  C/o memory issues - not worse. Feeling great.  Pt came w/her dtr again F/u wt loss   BP Readings from Last 3 Encounters:  02/26/14 111/58  11/27/13 136/52  09/27/13 140/85   Wt Readings from Last 3 Encounters:  02/26/14 102 lb (46.267 kg)  11/27/13 102 lb (46.267 kg)  09/27/13 103 lb (46.72 kg)      Review of Systems  Constitutional: Negative for fever, chills, diaphoresis, activity change, appetite change, fatigue and unexpected weight change.  HENT: Negative for congestion, dental problem, ear pain, hearing loss, mouth sores, postnasal drip, sinus pressure, sneezing, sore throat and voice change.   Eyes: Negative for pain and visual disturbance.  Respiratory: Negative for cough, chest tightness, wheezing and stridor.   Cardiovascular: Negative for chest pain, palpitations and leg swelling.  Gastrointestinal: Negative for nausea, vomiting, abdominal pain, blood in stool, abdominal distention and rectal pain.  Genitourinary: Negative for dysuria, hematuria, decreased urine volume, vaginal bleeding, vaginal discharge, difficulty urinating, vaginal pain and menstrual problem.  Musculoskeletal: Negative for back pain, gait problem, joint swelling and neck pain.  Skin: Negative for color change, rash and wound.  Neurological: Negative for dizziness, tremors, syncope, speech difficulty and light-headedness.  Hematological: Negative for adenopathy.  Psychiatric/Behavioral: Positive for sleep disturbance (sometimes) and decreased concentration. Negative for suicidal ideas, hallucinations, behavioral problems, confusion and dysphoric mood. The patient is nervous/anxious. The patient is not hyperactive.        Objective:   Physical Exam  Constitutional: She appears well-developed and well-nourished. No distress.  HENT:  Head: Normocephalic.  Right Ear: External ear normal.  Left Ear: External ear normal.  Nose: Nose  normal.  Mouth/Throat: Oropharynx is clear and moist.  Eyes: Conjunctivae are normal. Pupils are equal, round, and reactive to light. Right eye exhibits no discharge. Left eye exhibits no discharge.  Neck: Normal range of motion. Neck supple. No JVD present. No tracheal deviation present. No thyromegaly present.  Cardiovascular: Normal rate, regular rhythm and normal heart sounds.   Pulmonary/Chest: No stridor. No respiratory distress. She has no wheezes.  Abdominal: Soft. Bowel sounds are normal. She exhibits no distension and no mass. There is no tenderness. There is no rebound and no guarding.  Musculoskeletal: She exhibits no edema and no tenderness.  Lymphadenopathy:    She has no cervical adenopathy.  Neurological: She displays normal reflexes. No cranial nerve deficit. She exhibits normal muscle tone. Coordination normal.  Skin: No rash noted. No erythema.  Psychiatric: She has a normal mood and affect. Her behavior is normal. Judgment and thought content normal.  same LLE changes She looks happy MMS - decreased     Lab Results  Component Value Date   WBC 6.3 07/09/2013   HGB 14.7 07/09/2013   HCT 43.4 07/09/2013   PLT 256.0 07/09/2013   GLUCOSE 91 07/09/2013   CHOL 189 07/09/2013   TRIG 78.0 07/09/2013   HDL 81.80 07/09/2013   LDLCALC 92 07/09/2013   ALT 16 07/09/2013   AST 23 07/09/2013   NA 139 07/09/2013   K 4.0 07/09/2013   CL 102 07/09/2013   CREATININE 0.7 07/09/2013   BUN 29* 07/09/2013   CO2 29 07/09/2013   TSH 1.58 07/09/2013      Assessment & Plan:

## 2014-02-26 NOTE — Progress Notes (Signed)
Pre visit review using our clinic review tool, if applicable. No additional management support is needed unless otherwise documented below in the visit note. 

## 2014-02-26 NOTE — Assessment & Plan Note (Addendum)
Continue with current prescription therapy as reflected on the Med list.  We will reduce Lorazepam dose - sleepy at times

## 2014-02-27 MED ORDER — CIPROFLOXACIN HCL 250 MG PO TABS: 250.0000 mg | ORAL_TABLET | Freq: Every day | ORAL | Status: DC

## 2014-04-12 ENCOUNTER — Telehealth: Payer: Self-pay

## 2014-04-12 NOTE — Telephone Encounter (Signed)
Opened phone note by mistake.

## 2014-05-19 ENCOUNTER — Telehealth: Payer: Self-pay | Admitting: Internal Medicine

## 2014-05-19 MED ORDER — FLUOXETINE HCL 10 MG PO TABS: 5.0000 mg | ORAL_TABLET | Freq: Every day | ORAL | Status: DC

## 2014-05-19 NOTE — Telephone Encounter (Signed)
Notified pt refill has been sent to [place]...Regina Williams

## 2014-05-19 NOTE — Telephone Encounter (Signed)
Patient is requesting refill on prozac sent to [place] at [facility]

## 2014-06-12 ENCOUNTER — Other Ambulatory Visit: Payer: Self-pay | Admitting: Internal Medicine

## 2014-06-13 NOTE — Telephone Encounter (Signed)
Pt called in requesting refill on LORazepam (ATIVAN) 1 MG tablet [[id number]]

## 2014-06-16 NOTE — Telephone Encounter (Signed)
Refill called into [facility] spoke with Samere/pharmacist. Notified pt rx has been call into pharmacy...Regina Williams

## 2014-06-27 ENCOUNTER — Encounter: Payer: Self-pay | Admitting: Internal Medicine

## 2014-06-27 ENCOUNTER — Ambulatory Visit (INDEPENDENT_AMBULATORY_CARE_PROVIDER_SITE_OTHER): Payer: Medicare HMO | Admitting: Internal Medicine

## 2014-06-27 VITALS — BP 118/58 | HR 72 | Temp 97.8°F | Wt 104.0 lb

## 2014-06-27 DIAGNOSIS — M7061 Trochanteric bursitis, right hip: Secondary | ICD-10-CM

## 2014-06-27 DIAGNOSIS — F0390 Unspecified dementia without behavioral disturbance: Secondary | ICD-10-CM

## 2014-06-27 DIAGNOSIS — E785 Hyperlipidemia, unspecified: Secondary | ICD-10-CM

## 2014-06-27 DIAGNOSIS — Z23 Encounter for immunization: Secondary | ICD-10-CM

## 2014-06-27 DIAGNOSIS — F411 Generalized anxiety disorder: Secondary | ICD-10-CM

## 2014-06-27 NOTE — Progress Notes (Signed)
   Subjective:    HPI  F/u PVD, anxiety, osteoporosis.  C/o memory issues - not worse. Feeling great.  Pt came w/her dtr again F/u wt loss   BP Readings from Last 3 Encounters:  06/27/14 118/58  02/26/14 111/58  11/27/13 136/52   Wt Readings from Last 3 Encounters:  06/27/14 104 lb (47.174 kg)  02/26/14 102 lb (46.267 kg)  11/27/13 102 lb (46.267 kg)      Review of Systems  Constitutional: Negative for fever, chills, diaphoresis, activity change, appetite change, fatigue and unexpected weight change.  HENT: Negative for congestion, dental problem, ear pain, hearing loss, mouth sores, postnasal drip, sinus pressure, sneezing, sore throat and voice change.   Eyes: Negative for pain and visual disturbance.  Respiratory: Negative for cough, chest tightness, wheezing and stridor.   Cardiovascular: Negative for chest pain, palpitations and leg swelling.  Gastrointestinal: Negative for nausea, vomiting, abdominal pain, blood in stool, abdominal distention and rectal pain.  Genitourinary: Negative for dysuria, hematuria, decreased urine volume, vaginal bleeding, vaginal discharge, difficulty urinating, vaginal pain and menstrual problem.  Musculoskeletal: Negative for back pain, joint swelling, gait problem and neck pain.  Skin: Negative for color change, rash and wound.  Neurological: Negative for dizziness, tremors, syncope, speech difficulty and light-headedness.  Hematological: Negative for adenopathy.  Psychiatric/Behavioral: Positive for sleep disturbance (sometimes) and decreased concentration. Negative for suicidal ideas, hallucinations, behavioral problems, confusion and dysphoric mood. The patient is nervous/anxious. The patient is not hyperactive.        Objective:   Physical Exam  Constitutional: She appears well-developed. No distress.  HENT:  Head: Normocephalic.  Right Ear: External ear normal.  Left Ear: External ear normal.  Nose: Nose normal.  Mouth/Throat:  Oropharynx is clear and moist.  Eyes: Conjunctivae are normal. Pupils are equal, round, and reactive to light. Right eye exhibits no discharge. Left eye exhibits no discharge.  Neck: Normal range of motion. Neck supple. No JVD present. No tracheal deviation present. No thyromegaly present.  Cardiovascular: Normal rate, regular rhythm and normal heart sounds.   Pulmonary/Chest: No stridor. No respiratory distress. She has no wheezes.  Abdominal: Soft. Bowel sounds are normal. She exhibits no distension and no mass. There is no tenderness. There is no rebound and no guarding.  Musculoskeletal: She exhibits no edema or tenderness.  Lymphadenopathy:    She has no cervical adenopathy.  Neurological: She displays normal reflexes. No cranial nerve deficit. She exhibits normal muscle tone. Coordination normal.  Skin: No rash noted. No erythema.  Psychiatric: She has a normal mood and affect. Her behavior is normal. Judgment and thought content normal.  same LLE changes She looks happy MMS - decreased  R lat hip is tender over troch major SKs on face, neck    Lab Results  Component Value Date   WBC 6.3 07/09/2013   HGB 14.7 07/09/2013   HCT 43.4 07/09/2013   PLT 256.0 07/09/2013   GLUCOSE 91 07/09/2013   CHOL 189 07/09/2013   TRIG 78.0 07/09/2013   HDL 81.80 07/09/2013   LDLCALC 92 07/09/2013   ALT 16 07/09/2013   AST 23 07/09/2013   NA 139 07/09/2013   K 4.0 07/09/2013   CL 102 07/09/2013   CREATININE 0.7 07/09/2013   BUN 29* 07/09/2013   CO2 29 07/09/2013   TSH 1.58 07/09/2013      Assessment & Plan:

## 2014-06-27 NOTE — Assessment & Plan Note (Signed)
Continue with current prescription therapy as reflected on the Med list.  

## 2014-06-27 NOTE — Assessment & Plan Note (Signed)
Pillow top

## 2014-06-27 NOTE — Assessment & Plan Note (Signed)
  On diet  

## 2014-06-27 NOTE — Assessment & Plan Note (Signed)
Doing well 

## 2014-06-27 NOTE — Progress Notes (Signed)
Pre visit review using our clinic review tool, if applicable. No additional management support is needed unless otherwise documented below in the visit note. 

## 2014-06-27 NOTE — Patient Instructions (Signed)
Memory foam pillow top for [employer]

## 2014-08-04 ENCOUNTER — Telehealth: Payer: Self-pay | Admitting: *Deleted

## 2014-10-08 ENCOUNTER — Other Ambulatory Visit: Payer: Self-pay | Admitting: Internal Medicine

## 2014-10-09 MED ORDER — LORAZEPAM 1 MG PO TABS: 1.0000 mg | ORAL_TABLET | Freq: Two times a day (BID) | ORAL | Status: DC | PRN

## 2014-10-09 NOTE — Addendum Note (Signed)
Addended by: Lowella Dandy on: 10/09/2014 03:48 PM   Modules accepted: Orders

## 2014-10-27 ENCOUNTER — Ambulatory Visit (INDEPENDENT_AMBULATORY_CARE_PROVIDER_SITE_OTHER): Payer: Medicare HMO | Admitting: Internal Medicine

## 2014-10-27 ENCOUNTER — Encounter: Payer: Self-pay | Admitting: Internal Medicine

## 2014-10-27 ENCOUNTER — Other Ambulatory Visit (INDEPENDENT_AMBULATORY_CARE_PROVIDER_SITE_OTHER): Payer: Medicare HMO

## 2014-10-27 VITALS — BP 130/76 | HR 69 | Wt 101.0 lb

## 2014-10-27 DIAGNOSIS — F0390 Unspecified dementia without behavioral disturbance: Secondary | ICD-10-CM | POA: Diagnosis not present

## 2014-10-27 DIAGNOSIS — D485 Neoplasm of uncertain behavior of skin: Secondary | ICD-10-CM | POA: Diagnosis not present

## 2014-10-27 DIAGNOSIS — F411 Generalized anxiety disorder: Secondary | ICD-10-CM

## 2014-10-27 DIAGNOSIS — E785 Hyperlipidemia, unspecified: Secondary | ICD-10-CM

## 2014-10-27 DIAGNOSIS — I10 Essential (primary) hypertension: Secondary | ICD-10-CM | POA: Diagnosis not present

## 2014-10-27 DIAGNOSIS — J301 Allergic rhinitis due to pollen: Secondary | ICD-10-CM | POA: Diagnosis not present

## 2014-10-27 DIAGNOSIS — J309 Allergic rhinitis, unspecified: Secondary | ICD-10-CM | POA: Insufficient documentation

## 2014-10-27 LAB — BASIC METABOLIC PANEL
BUN: 27 mg/dL — ABNORMAL HIGH (ref 6–23)
CHLORIDE: 102 meq/L (ref 96–112)
CO2: 34 mEq/L — ABNORMAL HIGH (ref 19–32)
Calcium: 9.9 mg/dL (ref 8.4–10.5)
Creatinine, Ser: 0.71 mg/dL (ref 0.40–1.20)
GFR: 82.69 mL/min (ref >60.00)
Glucose, Bld: 93 mg/dL (ref 70–99)
Potassium: 4.5 mEq/L (ref 3.5–5.1)
SODIUM: 139 meq/L (ref 135–145)

## 2014-10-27 MED ORDER — LORATADINE 10 MG PO TABS: 10.0000 mg | ORAL_TABLET | Freq: Every day | ORAL | Status: DC

## 2014-10-27 NOTE — Assessment & Plan Note (Signed)
Doing well NAS diet

## 2014-10-27 NOTE — Assessment & Plan Note (Signed)
Seasonal  Claritin prn

## 2014-10-27 NOTE — Assessment & Plan Note (Signed)
On Lorazepam prn Prozac qd 

## 2014-10-27 NOTE — Progress Notes (Signed)
Pre visit review using our clinic review tool, if applicable. No additional management support is needed unless otherwise documented below in the visit note. 

## 2014-10-27 NOTE — Assessment & Plan Note (Signed)
sch a skin bx

## 2014-10-27 NOTE — Progress Notes (Signed)
   Subjective:    HPI  F/u PVD, anxiety, osteoporosis.  C/o a mole on neck  C/o memory issues - not worse. Feeling great.  Pt came w/her dtr Coralyn Mark. F/u wt loss   BP Readings from Last 3 Encounters:  06/27/14 118/58  02/26/14 111/58  11/27/13 136/52   Wt Readings from Last 3 Encounters:  06/27/14 104 lb (47.174 kg)  02/26/14 102 lb (46.267 kg)  11/27/13 102 lb (46.267 kg)      Review of Systems  Constitutional: Negative for fever, chills, diaphoresis, activity change, appetite change, fatigue and unexpected weight change.  HENT: Negative for congestion, dental problem, ear pain, hearing loss, mouth sores, postnasal drip, sinus pressure, sneezing, sore throat and voice change.   Eyes: Negative for pain and visual disturbance.  Respiratory: Negative for cough, chest tightness, wheezing and stridor.   Cardiovascular: Negative for chest pain, palpitations and leg swelling.  Gastrointestinal: Negative for nausea, vomiting, abdominal pain, blood in stool, abdominal distention and rectal pain.  Genitourinary: Negative for dysuria, hematuria, decreased urine volume, vaginal bleeding, vaginal discharge, difficulty urinating, vaginal pain and menstrual problem.  Musculoskeletal: Negative for back pain, joint swelling, gait problem and neck pain.  Skin: Negative for color change, rash and wound.  Neurological: Negative for dizziness, tremors, syncope, speech difficulty and light-headedness.  Hematological: Negative for adenopathy.  Psychiatric/Behavioral: Positive for sleep disturbance (sometimes) and decreased concentration. Negative for suicidal ideas, hallucinations, behavioral problems, confusion and dysphoric mood. The patient is nervous/anxious. The patient is not hyperactive.        Objective:   Physical Exam  Constitutional: She appears well-developed. No distress.  HENT:  Head: Normocephalic.  Right Ear: External ear normal.  Left Ear: External ear normal.  Nose: Nose  normal.  Mouth/Throat: Oropharynx is clear and moist.  Eyes: Conjunctivae are normal. Pupils are equal, round, and reactive to light. Right eye exhibits no discharge. Left eye exhibits no discharge.  Neck: Normal range of motion. Neck supple. No JVD present. No tracheal deviation present. No thyromegaly present.  Cardiovascular: Normal rate, regular rhythm and normal heart sounds.   Pulmonary/Chest: No stridor. No respiratory distress. She has no wheezes.  Abdominal: Soft. Bowel sounds are normal. She exhibits no distension and no mass. There is no tenderness. There is no rebound and no guarding.  Musculoskeletal: She exhibits no edema or tenderness.  Lymphadenopathy:    She has no cervical adenopathy.  Neurological: She displays normal reflexes. No cranial nerve deficit. She exhibits normal muscle tone. Coordination normal.  Skin: No rash noted. No erythema.  Psychiatric: She has a normal mood and affect. Her behavior is normal. Judgment and thought content normal.  same LLE changes She looks happy MMS - decreased  R lat hip is tender over troch major SKs on face,  A mole on L neck - irreg    Lab Results  Component Value Date   WBC 6.3 07/09/2013   HGB 14.7 07/09/2013   HCT 43.4 07/09/2013   PLT 256.0 07/09/2013   GLUCOSE 91 07/09/2013   CHOL 189 07/09/2013   TRIG 78.0 07/09/2013   HDL 81.80 07/09/2013   LDLCALC 92 07/09/2013   ALT 16 07/09/2013   AST 23 07/09/2013   NA 139 07/09/2013   K 4.0 07/09/2013   CL 102 07/09/2013   CREATININE 0.7 07/09/2013   BUN 29* 07/09/2013   CO2 29 07/09/2013   TSH 1.58 07/09/2013      Assessment & Plan:

## 2014-10-27 NOTE — Assessment & Plan Note (Signed)
On Lovastatin

## 2014-10-27 NOTE — Assessment & Plan Note (Signed)
Aricept.

## 2014-11-25 ENCOUNTER — Ambulatory Visit: Payer: Medicare HMO | Admitting: Internal Medicine

## 2014-12-06 ENCOUNTER — Other Ambulatory Visit: Payer: Self-pay | Admitting: Internal Medicine

## 2015-01-27 ENCOUNTER — Ambulatory Visit (INDEPENDENT_AMBULATORY_CARE_PROVIDER_SITE_OTHER): Payer: Medicare HMO | Admitting: Internal Medicine

## 2015-01-27 ENCOUNTER — Encounter: Payer: Self-pay | Admitting: Internal Medicine

## 2015-01-27 VITALS — BP 122/70 | HR 72 | Wt 97.0 lb

## 2015-01-27 DIAGNOSIS — F0390 Unspecified dementia without behavioral disturbance: Secondary | ICD-10-CM

## 2015-01-27 DIAGNOSIS — I739 Peripheral vascular disease, unspecified: Secondary | ICD-10-CM | POA: Diagnosis not present

## 2015-01-27 MED ORDER — LORAZEPAM 1 MG PO TABS: 1.0000 mg | ORAL_TABLET | Freq: Two times a day (BID) | ORAL | Status: DC | PRN

## 2015-01-27 MED ORDER — LOVASTATIN 20 MG PO TABS: ORAL_TABLET | ORAL | Status: DC

## 2015-01-27 MED ORDER — MEMANTINE HCL-DONEPEZIL HCL ER 14-10 MG PO CP24: 1.0000 | ORAL_CAPSULE | Freq: Every day | ORAL | Status: DC

## 2015-01-27 MED ORDER — FLUOXETINE HCL 10 MG PO TABS: 5.0000 mg | ORAL_TABLET | Freq: Every day | ORAL | Status: DC

## 2015-01-27 NOTE — Assessment & Plan Note (Signed)
2014-15 poss Alzheimer's Aricept to continue. Other options are discussed.

## 2015-01-27 NOTE — Progress Notes (Signed)
Subjective:  Patient ID: Regina Williams, female    DOB: Nov 28, 1924  Age: 81 y.o. MRN: [id number]  CC: No chief complaint on file.   HPI Regina Williams presents for HTN. Allergies, PVD, dementia - ?worse dtr Coralyn Mark moved hear by   Outpatient Prescriptions Prior to Visit  Medication Sig Dispense Refill  . aspirin 81 MG tablet Take 81 mg by mouth daily.      . calcium gluconate 500 MG tablet Take 500 mg by mouth 2 (two) times daily.      . cholecalciferol (VITAMIN D) 1000 UNITS tablet Take 2,000 Units by mouth daily.      . diphenhydrAMINE (BENADRYL) 25 mg capsule Take 25 mg by mouth at bedtime.      . donepezil (ARICEPT) 10 MG tablet TAKE ONE TABLET BY MOUTH ONCE DAILY AT BEDTIME 90 tablet 3  . Elastic Bandages & Supports (MEDICAL COMPRESSION STOCKINGS) MISC 1 each by Does not apply route daily. 2 each 0  . FLUoxetine (PROZAC) 10 MG tablet Take 0.5 tablets (5 mg total) by mouth daily. 30 tablet 5  . hydrocortisone (ANUSOL-HC) 25 MG suppository Place 1 suppository (25 mg total) rectally at bedtime. 12 suppository 0  . loratadine (CLARITIN) 10 MG tablet Take 1 tablet (10 mg total) by mouth daily. 300 tablet 1  . LORazepam (ATIVAN) 1 MG tablet Take 1 tablet (1 mg total) by mouth 2 (two) times daily as needed for anxiety. 60 tablet 3  . lovastatin (MEVACOR) 20 MG tablet TAKE ONE TABLET BY MOUTH EVERY DAY AT BEDTIME 90 tablet 3  . meloxicam (MOBIC) 7.5 MG tablet TAKE ONE TABLET BY MOUTH ONCE DAILY FOR ONE WEEK, THEN AS NEEDED 30 tablet 5  . NON FORMULARY Prosthetic bra     . Polyethyl Glycol-Propyl Glycol (SYSTANE PRESERVATIVE FREE) 0.4-0.3 % SOLN Place 1 drop into both eyes at bedtime.      Facility-Administered Medications Prior to Visit  Medication Dose Route Frequency Provider Last Rate Last Dose  . methylPREDNISolone acetate (DEPO-MEDROL) injection 40 mg  40 mg Intra-articular Once Aleksei Plotnikov V, MD        ROS Review of Systems  Constitutional: Negative for chills, activity  change, appetite change, fatigue and unexpected weight change.  HENT: Negative for congestion, mouth sores and sinus pressure.   Eyes: Negative for visual disturbance.  Respiratory: Negative for cough, chest tightness and wheezing.   Gastrointestinal: Negative for nausea and abdominal pain.  Genitourinary: Negative for frequency, difficulty urinating and vaginal pain.  Musculoskeletal: Negative for back pain and gait problem.  Skin: Negative for pallor and rash.  Neurological: Negative for dizziness, tremors, weakness, numbness and headaches.  Psychiatric/Behavioral: Negative for suicidal ideas, confusion and sleep disturbance.    Objective:  BP 122/70 mmHg  Pulse 72  Wt 97 lb (43.999 kg)  SpO2 97%  BP Readings from Last 3 Encounters:  01/27/15 122/70  10/27/14 130/76  06/27/14 118/58    Wt Readings from Last 3 Encounters:  01/27/15 97 lb (43.999 kg)  10/27/14 101 lb (45.813 kg)  06/27/14 104 lb (47.174 kg)    Physical Exam  Constitutional: She appears well-developed. No distress.  HENT:  Head: Normocephalic.  Right Ear: External ear normal.  Left Ear: External ear normal.  Nose: Nose normal.  Mouth/Throat: Oropharynx is clear and moist.  Eyes: Conjunctivae are normal. Pupils are equal, round, and reactive to light. Right eye exhibits no discharge. Left eye exhibits no discharge.  Neck: Normal range of motion. Neck  supple. No JVD present. No tracheal deviation present. No thyromegaly present.  Cardiovascular: Normal rate, regular rhythm and normal heart sounds.   Pulmonary/Chest: No stridor. No respiratory distress. She has no wheezes.  Abdominal: Soft. Bowel sounds are normal. She exhibits no distension and no mass. There is no tenderness. There is no rebound and no guarding.  Musculoskeletal: She exhibits no edema or tenderness.  Lymphadenopathy:    She has no cervical adenopathy.  Neurological: She displays normal reflexes. No cranial nerve deficit. She exhibits  normal muscle tone. Coordination normal.  Skin: No rash noted. No erythema.  Psychiatric: She has a normal mood and affect. Her behavior is normal. Judgment and thought content normal.    Lab Results  Component Value Date   WBC 6.3 07/09/2013   HGB 14.7 07/09/2013   HCT 43.4 07/09/2013   PLT 256.0 07/09/2013   GLUCOSE 93 10/27/2014   CHOL 189 07/09/2013   TRIG 78.0 07/09/2013   HDL 81.80 07/09/2013   LDLCALC 92 07/09/2013   ALT 16 07/09/2013   AST 23 07/09/2013   NA 139 10/27/2014   K 4.5 10/27/2014   CL 102 10/27/2014   CREATININE 0.71 10/27/2014   BUN 27* 10/27/2014   CO2 34* 10/27/2014   TSH 1.58 07/09/2013       Assessment & Plan:   There are no diagnoses linked to this encounter. I am having Ms. Paternostro maintain her aspirin, diphenhydrAMINE, calcium gluconate, NON FORMULARY, Polyethyl Glycol-Propyl Glycol, cholecalciferol, Medical Compression Stockings, hydrocortisone, meloxicam, lovastatin, FLUoxetine, LORazepam, loratadine, and donepezil. We will continue to administer methylPREDNISolone acetate.  No orders of the defined types were placed in this encounter.     Follow-up: No Follow-up on file.  Walker Kehr, MD

## 2015-01-27 NOTE — Progress Notes (Signed)
Pre visit review using our clinic review tool, if applicable. No additional management support is needed unless otherwise documented below in the visit note. 

## 2015-01-27 NOTE — Assessment & Plan Note (Signed)
Carotids B Lovastatin and ASA

## 2015-04-09 ENCOUNTER — Observation Stay (HOSPITAL_COMMUNITY)
Admission: EM | Admit: 2015-04-09 | Discharge: 2015-04-10 | Disposition: A | Discharge status: Home / Self Care | Payer: Medicare HMO | Attending: Internal Medicine | Admitting: Internal Medicine

## 2015-04-09 ENCOUNTER — Observation Stay (HOSPITAL_COMMUNITY): Payer: Medicare HMO

## 2015-04-09 ENCOUNTER — Emergency Department (HOSPITAL_COMMUNITY): Payer: Medicare HMO

## 2015-04-09 DIAGNOSIS — R55 Syncope and collapse: Secondary | ICD-10-CM | POA: Diagnosis not present

## 2015-04-09 DIAGNOSIS — E785 Hyperlipidemia, unspecified: Secondary | ICD-10-CM | POA: Insufficient documentation

## 2015-04-09 DIAGNOSIS — I951 Orthostatic hypotension: Secondary | ICD-10-CM | POA: Diagnosis present

## 2015-04-09 DIAGNOSIS — I6522 Occlusion and stenosis of left carotid artery: Secondary | ICD-10-CM | POA: Diagnosis not present

## 2015-04-09 DIAGNOSIS — F4323 Adjustment disorder with mixed anxiety and depressed mood: Secondary | ICD-10-CM | POA: Insufficient documentation

## 2015-04-09 DIAGNOSIS — I739 Peripheral vascular disease, unspecified: Secondary | ICD-10-CM | POA: Insufficient documentation

## 2015-04-09 DIAGNOSIS — F0390 Unspecified dementia without behavioral disturbance: Secondary | ICD-10-CM | POA: Diagnosis not present

## 2015-04-09 DIAGNOSIS — I10 Essential (primary) hypertension: Secondary | ICD-10-CM | POA: Diagnosis not present

## 2015-04-09 LAB — HEPATIC FUNCTION PANEL
ALK PHOS: 45 U/L (ref 38–126)
ALT: 29 U/L (ref 14–54)
AST: 29 U/L (ref 15–41)
Albumin: 3.6 g/dL (ref 3.5–5.0)
BILIRUBIN DIRECT: 0.2 mg/dL (ref 0.1–0.5)
BILIRUBIN INDIRECT: 0.8 mg/dL (ref 0.3–0.9)
BILIRUBIN TOTAL: 1 mg/dL (ref 0.3–1.2)
Total Protein: 5.4 g/dL — ABNORMAL LOW (ref 6.5–8.1)

## 2015-04-09 LAB — CBC
HCT: 39.6 % (ref 36.0–46.0)
HEMOGLOBIN: 13 g/dL (ref 12.0–15.0)
MCH: 29.7 pg (ref 26.0–34.0)
MCHC: 32.8 g/dL (ref 30.0–36.0)
MCV: 90.6 fL (ref 78.0–100.0)
Platelets: 170 10*3/uL (ref 150–400)
RBC: 4.37 MIL/uL (ref 3.87–5.11)
RDW: 13 % (ref 11.5–15.5)
WBC: 9.2 10*3/uL (ref 4.0–10.5)

## 2015-04-09 LAB — URINALYSIS, ROUTINE W REFLEX MICROSCOPIC
BILIRUBIN URINE: NEGATIVE
Glucose, UA: NEGATIVE mg/dL
HGB URINE DIPSTICK: NEGATIVE
Ketones, ur: 15 mg/dL — AB
NITRITE: NEGATIVE
PROTEIN: NEGATIVE mg/dL
Specific Gravity, Urine: 1.011 (ref 1.005–1.030)
UROBILINOGEN UA: 0.2 mg/dL (ref 0.0–1.0)
pH: 7.5 (ref 5.0–8.0)

## 2015-04-09 LAB — URINE MICROSCOPIC-ADD ON

## 2015-04-09 LAB — BASIC METABOLIC PANEL
ANION GAP: 7 (ref 5–15)
BUN: 22 mg/dL — AB (ref 6–20)
CALCIUM: 8.8 mg/dL — AB (ref 8.9–10.3)
CO2: 27 mmol/L (ref 22–32)
Chloride: 110 mmol/L (ref 101–111)
Creatinine, Ser: 0.82 mg/dL (ref 0.44–1.00)
GFR calc Af Amer: >60 mL/min (ref >60)
GLUCOSE: 93 mg/dL (ref 65–99)
POTASSIUM: 3.6 mmol/L (ref 3.5–5.1)
SODIUM: 144 mmol/L (ref 135–145)

## 2015-04-09 LAB — CK: CK TOTAL: 89 U/L (ref 38–234)

## 2015-04-09 LAB — TSH: TSH: 1.131 u[IU]/mL (ref 0.350–4.500)

## 2015-04-09 LAB — POCT I-STAT TROPONIN I: TROPONIN I, POC: 0.01 ng/mL (ref 0.00–0.08)

## 2015-04-09 MED ORDER — MEMANTINE HCL ER 14 MG PO CP24
14.0000 mg | ORAL_CAPSULE | Freq: Every day | ORAL | Status: DC
  Administered 2015-04-10: 14 mg via ORAL
  Filled 2015-04-09: qty 1

## 2015-04-09 MED ORDER — ENOXAPARIN SODIUM 40 MG/0.4ML ~~LOC~~ SOLN
40.0000 mg | SUBCUTANEOUS | Status: DC
  Administered 2015-04-09: 40 mg via SUBCUTANEOUS
  Filled 2015-04-09: qty 0.4

## 2015-04-09 MED ORDER — ASPIRIN 81 MG PO TABS: 81.0000 mg | ORAL_TABLET | Freq: Every day | ORAL | Status: DC

## 2015-04-09 MED ORDER — ONDANSETRON HCL 4 MG/2ML IJ SOLN: 4.0000 mg | Freq: Four times a day (QID) | INTRAMUSCULAR | Status: DC | PRN

## 2015-04-09 MED ORDER — ASPIRIN EC 81 MG PO TBEC
81.0000 mg | DELAYED_RELEASE_TABLET | Freq: Every day | ORAL | Status: DC
  Administered 2015-04-10: 81 mg via ORAL
  Filled 2015-04-09: qty 1

## 2015-04-09 MED ORDER — PRAVASTATIN SODIUM 20 MG PO TABS
20.0000 mg | ORAL_TABLET | Freq: Every day | ORAL | Status: DC
  Administered 2015-04-10: 20 mg via ORAL
  Filled 2015-04-09: qty 1

## 2015-04-09 MED ORDER — VITAMIN D 1000 UNITS PO TABS
2000.0000 [IU] | ORAL_TABLET | Freq: Every day | ORAL | Status: DC
  Administered 2015-04-10: 2000 [IU] via ORAL
  Filled 2015-04-09: qty 2

## 2015-04-09 MED ORDER — ONDANSETRON HCL 4 MG PO TABS: 4.0000 mg | ORAL_TABLET | Freq: Four times a day (QID) | ORAL | Status: DC | PRN

## 2015-04-09 MED ORDER — CALCIUM GLUCONATE 500 MG PO TABS
500.0000 mg | ORAL_TABLET | Freq: Two times a day (BID) | ORAL | Status: DC
  Administered 2015-04-09 – 2015-04-10 (×2): 500 mg via ORAL
  Filled 2015-04-09 (×3): qty 1

## 2015-04-09 MED ORDER — MEMANTINE HCL 5 MG PO TABS
14.0000 mg | ORAL_TABLET | Freq: Every day | ORAL | Status: DC
Start: 2015-04-09 — End: 2015-04-09

## 2015-04-09 MED ORDER — FLUOXETINE HCL 10 MG PO CAPS
10.0000 mg | ORAL_CAPSULE | Freq: Every day | ORAL | Status: DC
  Administered 2015-04-10: 10 mg via ORAL
  Filled 2015-04-09: qty 1

## 2015-04-09 MED ORDER — SODIUM CHLORIDE 0.9 % IV BOLUS (SEPSIS)
1000.0000 mL | Freq: Once | INTRAVENOUS | Status: AC
  Administered 2015-04-09: 1000 mL via INTRAVENOUS

## 2015-04-09 MED ORDER — SODIUM CHLORIDE 0.9 % IJ SOLN
3.0000 mL | Freq: Two times a day (BID) | INTRAMUSCULAR | Status: DC
  Administered 2015-04-09 – 2015-04-10 (×2): 3 mL via INTRAVENOUS

## 2015-04-09 MED ORDER — ACETAMINOPHEN 325 MG PO TABS: 650.0000 mg | ORAL_TABLET | Freq: Four times a day (QID) | ORAL | Status: DC | PRN

## 2015-04-09 MED ORDER — LORATADINE 10 MG PO TABS
10.0000 mg | ORAL_TABLET | Freq: Every day | ORAL | Status: DC
  Administered 2015-04-10: 10 mg via ORAL
  Filled 2015-04-09: qty 1

## 2015-04-09 MED ORDER — ACETAMINOPHEN 650 MG RE SUPP: 650.0000 mg | Freq: Four times a day (QID) | RECTAL | Status: DC | PRN

## 2015-04-09 MED ORDER — DOCUSATE SODIUM 100 MG PO CAPS
100.0000 mg | ORAL_CAPSULE | Freq: Two times a day (BID) | ORAL | Status: DC
  Administered 2015-04-09 – 2015-04-10 (×2): 100 mg via ORAL
  Filled 2015-04-09 (×2): qty 1

## 2015-04-09 MED ORDER — ENOXAPARIN SODIUM 30 MG/0.3ML ~~LOC~~ SOLN: 30.0000 mg | SUBCUTANEOUS | Status: DC

## 2015-04-09 MED ORDER — SODIUM CHLORIDE 0.9 % IV SOLN
INTRAVENOUS | Status: DC
  Administered 2015-04-09 – 2015-04-10 (×3): via INTRAVENOUS

## 2015-04-09 MED ORDER — LORAZEPAM 1 MG PO TABS: 1.0000 mg | ORAL_TABLET | Freq: Two times a day (BID) | ORAL | Status: DC | PRN

## 2015-04-09 MED ORDER — MEDICAL COMPRESSION STOCKINGS MISC: 1.0000 | Freq: Every day | Status: DC

## 2015-04-09 MED ORDER — ALUM & MAG HYDROXIDE-SIMETH 200-200-20 MG/5ML PO SUSP: 30.0000 mL | Freq: Four times a day (QID) | ORAL | Status: DC | PRN

## 2015-04-09 MED ORDER — POLYVINYL ALCOHOL 1.4 % OP SOLN
1.0000 [drp] | Freq: Every day | OPHTHALMIC | Status: DC
  Filled 2015-04-09: qty 15

## 2015-04-09 NOTE — ED Provider Notes (Signed)
CSN: [id number]     Arrival date & time 04/09/15  1018 History   First MD Initiated Contact with Patient 04/09/15 1020     Chief Complaint  Patient presents with  . Loss of Consciousness     (Consider location/radiation/quality/duration/timing/severity/associated sxs/prior Treatment) Patient is a 81 y.o. female presenting with syncope. The history is provided by the patient.  Loss of Consciousness Episode history:  Multiple Most recent episode:  Today Timing:  Constant Progression:  Unchanged Chronicity:  New Context: standing up   Context comment:  Was standing talking to a friend, then had an episode after standing up to go to the bathroom Relieved by:  Nothing Ineffective treatments:  None tried Associated symptoms: no chest pain, no fever and no shortness of breath     Past Medical History  Diagnosis Date  . Anxiety   . Breast cancer 1994    right  . Hyperlipidemia   . Hypertension   . Osteoporosis   . Dry eyes   . Cystocele   . Internal hemorrhoids   . Liver lesion 2013    multiple   Past Surgical History  Procedure Laterality Date  . Cholecystectomy    . Abdominal hysterectomy    . Mastectomy     Family History  Problem Relation Age of Onset  . Heart disease Mother 28    MI  . Colon cancer Neg Hx    Social History  Substance Use Topics  . Smoking status: Never Smoker   . Smokeless tobacco: Never Used  . Alcohol Use: No   OB History    No data available     Review of Systems  Constitutional: Negative for fever.  Respiratory: Negative for cough and shortness of breath.   Cardiovascular: Positive for syncope. Negative for chest pain and leg swelling.  All other systems reviewed and are negative.     Allergies  Review of patient's allergies indicates no known allergies.  Home Medications   Prior to Admission medications   Medication Sig Start Date End Date Taking? Authorizing Provider  aspirin 81 MG tablet Take 81 mg by mouth daily.       Historical Provider, MD  calcium gluconate 500 MG tablet Take 500 mg by mouth 2 (two) times daily.      Historical Provider, MD  cholecalciferol (VITAMIN D) 1000 UNITS tablet Take 2,000 Units by mouth daily.      Historical Provider, MD  diphenhydrAMINE (BENADRYL) 25 mg capsule Take 25 mg by mouth at bedtime.      Historical Provider, MD  Elastic Bandages & Supports (MEDICAL COMPRESSION STOCKINGS) [place] 1 each by Does not apply route daily. 12/19/12   Jearld Fenton, NP  FLUoxetine (PROZAC) 10 MG tablet Take 0.5 tablets (5 mg total) by mouth daily. 01/27/15   Aleksei Plotnikov V, MD  hydrocortisone (ANUSOL-HC) 25 MG suppository Place 1 suppository (25 mg total) rectally at bedtime. 01/04/13   Lafayette Dragon, MD  loratadine (CLARITIN) 10 MG tablet Take 1 tablet (10 mg total) by mouth daily. 10/27/14   Aleksei Plotnikov V, MD  LORazepam (ATIVAN) 1 MG tablet Take 1 tablet (1 mg total) by mouth 2 (two) times daily as needed for anxiety. 01/27/15   Aleksei Plotnikov V, MD  lovastatin (MEVACOR) 20 MG tablet TAKE ONE TABLET BY MOUTH EVERY DAY AT BEDTIME 01/27/15   Aleksei Plotnikov V, MD  meloxicam (MOBIC) 7.5 MG tablet TAKE ONE TABLET BY MOUTH ONCE DAILY FOR ONE WEEK, THEN AS NEEDED 07/02/13  Aleksei Plotnikov V, MD  Memantine HCl-Donepezil HCl (NAMZARIC) 14-10 MG CP24 Take 1 capsule by mouth daily. 01/27/15   Cassandria Anger, MD  NON FORMULARY Prosthetic bra     Historical Provider, MD  Polyethyl Glycol-Propyl Glycol (SYSTANE PRESERVATIVE FREE) 0.4-0.3 % SOLN Place 1 drop into both eyes at bedtime.     Historical Provider, MD   BP 112/47 mmHg  Pulse 65  Temp(Src) 97.7 F (36.5 C) (Oral)  Resp 16  SpO2 98% Physical Exam  Constitutional: She is oriented to person, place, and time. She appears well-developed and well-nourished. No distress.  HENT:  Head: Normocephalic and atraumatic.  Mouth/Throat: Oropharynx is clear and moist.  Eyes: EOM are normal. Pupils are equal, round, and reactive to light.  Neck:  Normal range of motion. Neck supple.  Cardiovascular: Normal rate and regular rhythm.  Exam reveals no friction rub.   No murmur heard. Pulmonary/Chest: Effort normal and breath sounds normal. No respiratory distress. She has no wheezes. She has no rales.  Abdominal: Soft. She exhibits no distension. There is no tenderness. There is no rebound.  Musculoskeletal: Normal range of motion. She exhibits no edema.  Neurological: She is alert and oriented to person, place, and time. No cranial nerve deficit. She exhibits normal muscle tone. Coordination normal.  Skin: Skin is warm. No rash noted. She is not diaphoretic.  Nursing note and vitals reviewed.   ED Course  Procedures (including critical care time) Labs Review Labs Reviewed  BASIC METABOLIC PANEL - Abnormal; Notable for the following:    BUN 22 (*)    Calcium 8.8 (*)    All other components within normal limits  URINALYSIS, ROUTINE W REFLEX MICROSCOPIC (NOT AT [facility]) - Abnormal; Notable for the following:    APPearance HAZY (*)    Ketones, ur 15 (*)    Leukocytes, UA TRACE (*)    All other components within normal limits  HEPATIC FUNCTION PANEL - Abnormal; Notable for the following:    Total Protein 5.4 (*)    All other components within normal limits  URINE MICROSCOPIC-ADD ON - Abnormal; Notable for the following:    Squamous Epithelial / LPF FEW (*)    Bacteria, UA FEW (*)    Crystals CA OXALATE CRYSTALS (*)    All other components within normal limits  URINE CULTURE  CBC  CK  TSH  I-STAT TROPOININ, ED  POCT I-STAT TROPONIN I    Imaging Review No results found. I have personally reviewed and evaluated these images and lab results as part of my medical decision-making.   EKG Interpretation   Date/Time:  Thursday April 09 2015 10:42:00 EDT Ventricular Rate:  66 PR Interval:  152 QRS Duration: 83 QT Interval:  439 QTC Calculation: 460 R Axis:   16 Text Interpretation:  Sinus rhythm Low voltage, extremity  leads Similar to  prior from earlier today, no EKGs prior to today Shows signs of [place]  flutter with 4:1 block Confirmed by Mingo Amber  MD, Siya Flurry ([id number]) on 04/09/2015  10:53:52 AM      MDM   Final diagnoses:  Orthostatic syncope    86F here after a syncopal episode. Passed out twice, first episode while she was standing talking to a drink. Orthostatic with EMS. Normal blood sugar. No preceding symptoms, feels fine now. Neurologically intact.  Will check labs. Labs ok. Urine added. Admitted.    Evelina Bucy, MD 04/09/15 [phone]

## 2015-04-09 NOTE — ED Notes (Signed)
Per EMS - pt from home. Family friend checked on pt and reports pt passed out in front of him - he caught her, no injuries. Pale/diaphoretic upon arrival. Helped pt stand to go to restroom, pt passed out again - caught, no trauma/injuries. Positive for orthostatic changes. BP 73/42 standing. 500cc fluid. EKG unremarkable. CBG 133, hr 60bpm reg, rr 16. No complaints. Hx forgetfulness. Pt did not take meds this morning or eat breakfast.

## 2015-04-09 NOTE — ED Notes (Signed)
Admitting at bedside 

## 2015-04-09 NOTE — ED Notes (Signed)
Pt to CT/x-ray.

## 2015-04-09 NOTE — Progress Notes (Signed)
NURSING PROGRESS NOTE  DANNELL RACZKOWSKI [id number] Admission Data: 04/09/2015 4:09 PM Attending Provider: Hosie Poisson, MD RXY:VOPF Plotnikov, MD Code Status: Full  DEVOIRY CORRIHER is a 81 y.o. female patient admitted from ED:  -No acute distress noted.  -No complaints of shortness of breath.  -No complaints of chest pain.   Cardiac Monitoring: Box # 17 in place. Cardiac monitor yields:normal sinus rhythm.  Blood pressure 122/46, pulse 74, temperature 97.8 F (36.6 C), temperature source Oral, resp. rate 20, height 5\' 1"  (1.549 m), weight 43.455 kg (95 lb 12.8 oz), SpO2 96 %.   IV Fluids:  IV in place, occlusive dsg intact without redness, IV cath antecubital left, condition patent and no redness none.   Allergies:  Review of patient's allergies indicates no known allergies.  Past Medical History:   has a past medical history of Anxiety; Breast cancer (1994); Hyperlipidemia; Hypertension; Osteoporosis; Dry eyes; Cystocele; Internal hemorrhoids; and Liver lesion (2013).  Past Surgical History:   has past surgical history that includes Cholecystectomy; Abdominal hysterectomy; and Mastectomy.  Social History:   reports that she has never smoked. She has never used smokeless tobacco. She reports that she does not drink alcohol or use illicit drugs.  Skin: Intact  Patient/Family orientated to room. Information packet given to patient/family. Admission inpatient armband information verified with patient/family to include name and date of birth and placed on patient arm. Side rails up x 2, fall assessment and education completed with patient/family. Patient/family able to verbalize understanding of risk associated with falls and verbalized understanding to call for assistance before getting out of bed. Call light within reach. Patient/family able to voice and demonstrate understanding of unit orientation instructions.    Will continue to evaluate and treat per MD orders.

## 2015-04-09 NOTE — Progress Notes (Signed)
Patient asking for her purse that she may have left in emergency room. Only belongings with patient are clothes. ED contacted to look for purse. No purse found.

## 2015-04-09 NOTE — H&P (Signed)
[facility] History and Physical                                                                                    Regina Williams, is a 81 y.o. female  MRN: [id number]   DOB - 1925/04/17  Admit Date - 04/09/2015  Outpatient Primary MD for the patient is Walker Kehr, MD  Referring MD: Lorenda Ishihara  With History of -  Past Medical History  Diagnosis Date  . Anxiety   . Breast cancer 1994    right  . Hyperlipidemia   . Hypertension   . Osteoporosis   . Dry eyes   . Cystocele   . Internal hemorrhoids   . Liver lesion 2013    multiple      Past Surgical History  Procedure Laterality Date  . Cholecystectomy    . Abdominal hysterectomy    . Mastectomy      in for   Chief Complaint  Patient presents with  . Loss of Consciousness     HPI This is an 81 year old female patient who still lives at home has a past medical history of hypertension, dyslipidemia, peripheral vascular disease and dementia. Patient had 2 apparent episodes of witnessed syncope. A family friend who had come by to check on the patient at the request from the family witnessed initial episode. The patient was called and sustained no injuries. MS was notified. She was pale and diaphoretic upon their arrival. Patient had a second episode when help to stand to go to the restroom. She was orthostatic at that time with standing blood pressure 72/42. She was given 500 mL of fluid prior to arrival. Her EKG was unremarkable. Her CBG was 133. Her heart rate was 60 and regular without any ectopy. He apparently had not taken any medications this morning.  Upon arrival to this facility she was afebrile, blood pressure was 110/61 lying, pulse was 70 and regular respirations 17 and room air saturation was 98%. She was given an additional 1000 mL of fluid as a bolus. Repeat orthostatic vital signs were obtained and patient was no longer orthostatic. Her toward data was unremarkable except for BUN of 22. Two-view chest  x-ray appears unremarkable. Urinalysis is pending. In review of patient's preadmission medication she was on Aricept and nocturnal Benadryl both of which can contribute to orthostasis.  In discussing with the patient she had no prodrome or any presyncopal symptoms of dizziness or weakness. She denies issues with poor intake or lack of appetite recently. He denied recent nausea vomiting or diarrhea, no blood in stools. No constitutional symptoms such as fever chills. No dysuria. Patient admitted that 2 weeks ago she had a mechanical fall that she describes as slipping on the hardwood floor. This occurred in her bedroom and as she fell she hit the side of her head on the closet door but did not have any loss of consciousness or any significant injuries. She reports compliance with previously ordered compression hose at home.   Review of Systems   In addition to the HPI above,  No Fever-chills, myalgias or other constitutional symptoms No Headache, changes with Vision or hearing, new  weakness, tingling, numbness in any extremity, No problems swallowing food or Liquids, indigestion/reflux No Chest pain, Cough or Shortness of Breath, palpitations, orthopnea or DOE No Abdominal pain, N/V; no melena or hematochezia, no dark tarry stools, Bowel movements are regular, No dysuria, hematuria or flank pain No new skin rashes, lesions, masses or bruises, No new joints pains-aches No recent weight gain or loss No polyuria, polydypsia or polyphagia,  *A full 10 point Review of Systems was done, except as stated above, all other Review of Systems were negative.  Social History Social History  Substance Use Topics  . Smoking status: Never Smoker   . Smokeless tobacco: Never Used  . Alcohol Use: No    Resides at:  Private residence  Lives with: Lives alone  Ambulatory status:   Family History Family History  Problem Relation Age of Onset  . Heart disease Mother 19    MI  . Colon cancer Neg Hx        Prior to Admission medications   Medication Sig Start Date End Date Taking? Authorizing Provider  aspirin 81 MG tablet Take 81 mg by mouth daily.      Historical Provider, MD  calcium gluconate 500 MG tablet Take 500 mg by mouth 2 (two) times daily.      Historical Provider, MD  cholecalciferol (VITAMIN D) 1000 UNITS tablet Take 2,000 Units by mouth daily.      Historical Provider, MD  diphenhydrAMINE (BENADRYL) 25 mg capsule Take 25 mg by mouth at bedtime.      Historical Provider, MD  Elastic Bandages & Supports (MEDICAL COMPRESSION STOCKINGS) [place] 1 each by Does not apply route daily. 12/19/12   Jearld Fenton, NP  FLUoxetine (PROZAC) 10 MG tablet Take 0.5 tablets (5 mg total) by mouth daily. 01/27/15   Aleksei Plotnikov V, MD  hydrocortisone (ANUSOL-HC) 25 MG suppository Place 1 suppository (25 mg total) rectally at bedtime. 01/04/13   Lafayette Dragon, MD  loratadine (CLARITIN) 10 MG tablet Take 1 tablet (10 mg total) by mouth daily. 10/27/14   Aleksei Plotnikov V, MD  LORazepam (ATIVAN) 1 MG tablet Take 1 tablet (1 mg total) by mouth 2 (two) times daily as needed for anxiety. 01/27/15   Aleksei Plotnikov V, MD  lovastatin (MEVACOR) 20 MG tablet TAKE ONE TABLET BY MOUTH EVERY DAY AT BEDTIME 01/27/15   Aleksei Plotnikov V, MD  meloxicam (MOBIC) 7.5 MG tablet TAKE ONE TABLET BY MOUTH ONCE DAILY FOR ONE WEEK, THEN AS NEEDED 07/02/13   Lew Dawes V, MD  Memantine HCl-Donepezil HCl (NAMZARIC) 14-10 MG CP24 Take 1 capsule by mouth daily. 01/27/15   Cassandria Anger, MD  NON FORMULARY Prosthetic bra     Historical Provider, MD  Polyethyl Glycol-Propyl Glycol (SYSTANE PRESERVATIVE FREE) 0.4-0.3 % SOLN Place 1 drop into both eyes at bedtime.     Historical Provider, MD    No Known Allergies  Physical Exam  Vitals  Blood pressure 123/45, pulse 72, temperature 97.7 F (36.5 C), temperature source Oral, resp. rate 15, SpO2 98 %.   General:  In no acute distress, appears healthy and well  nourished  Psych:  Normal affect, Denies Suicidal or Homicidal ideations, Awake Alert, Oriented X 3 when friends at bedside but EDP noted that if left alone becomes quickly disoriented. Speech and thought patterns are clear and appropriate, no apparent short term memory deficits  Neuro:   No focal neurological deficits, CN II through XII intact, Strength 5/5 all 4 extremities, Sensation  intact all 4 extremities. No nystagmus when EOMs were tested.  ENT:  Ears and Eyes appear Normal, Conjunctivae clear, PER. Moist oral mucosa without erythema or exudates.  Neck:  Supple, No lymphadenopathy appreciated  Respiratory:  Symmetrical chest wall movement, Good air movement bilaterally, CTAB. Room Air  Cardiac:  RRR, No Murmurs that does have an S4, no LE edema noted, no JVD, No carotid bruits, peripheral pulses palpable at 2+  Abdomen:  Positive bowel sounds, Soft, Non tender, Non distended,  No masses appreciated, no obvious hepatosplenomegaly  Skin:  No Cyanosis, Normal Skin Turgor, No Skin Rash or Bruise.  Extremities: Symmetrical without obvious trauma or injury,  no effusions.  Data Review  CBC  Recent Labs Lab 04/09/15 1139  WBC 9.2  HGB 13.0  HCT 39.6  PLT 170  MCV 90.6  MCH 29.7  MCHC 32.8  RDW 13.0    Chemistries   Recent Labs Lab 04/09/15 1139  NA 144  K 3.6  CL 110  CO2 27  GLUCOSE 93  BUN 22*  CREATININE 0.82  CALCIUM 8.8*    CrCl cannot be calculated (Unknown ideal weight.).  No results for input(s): TSH, T4TOTAL, T3FREE, THYROIDAB in the last 72 hours.  Invalid input(s): FREET3  Coagulation profile No results for input(s): INR, PROTIME in the last 168 hours.  No results for input(s): DDIMER in the last 72 hours.  Cardiac Enzymes No results for input(s): CKMB, TROPONINI, MYOGLOBIN in the last 168 hours.  Invalid input(s): CK  Invalid input(s): POCBNP  Urinalysis    Component Value Date/Time   COLORURINE YELLOW 02/26/2014 [place] 02/26/2014 1411   LABSPEC 1.010 02/26/2014 1411   PHURINE 6.0 02/26/2014 1411   GLUCOSEU NEGATIVE 02/26/2014 1411   HGBUR TRACE-LYSED* 02/26/2014 1411   BILIRUBINUR NEGATIVE 02/26/2014 1411   BILIRUBINUR negative 02/17/2012 1531   KETONESUR NEGATIVE 02/26/2014 1411   PROTEINUR negative 02/17/2012 1531   UROBILINOGEN 0.2 02/26/2014 1411   UROBILINOGEN 0.2 02/17/2012 1531   NITRITE NEGATIVE 02/26/2014 1411   NITRITE negative 02/17/2012 1531   LEUKOCYTESUR MODERATE* 02/26/2014 1411    Imaging results:   No results found.   EKG: (Independently reviewed) sinus rhythm with a ventricular rate of 66 bpm, QTC 460 ms, ST segments and T waves without any evidence of ischemia, patient does have biphasic P waves.   Assessment & Plan  Principal Problem:   Orthostatic syncope -Admit to telemetry/observational status -Suspect combination of mild volume depletion and side effects from medications (Aricept and Benadryl) contributory -Patient takes combination memantine and donepezil; memantine does not contribute to orthostasis and may be able to resume this medication as single agent prior to discharge -Unclear why patient having volume depletion; does not have leukocytosis and fever but we'll check urinalysis and culture to rule out occult infection -For completeness of evaluation will check CT of the head without contrast and 2-D echocardiogram -EKG unremarkable and initial troponin negative; cycle troponin only if has reports of chest pain prior to arrival or since arrival -Preadmission had orders for elastic bandages/elastic supports i.e. compression hose; have reordered here  Active Problems:   Dementia -Apparently mildly unable to live at home -Medication changes/adjustments as above -Given syncopal episode PT and OT evaluation -Have ordered the Memantine; will need prescription for this at discharge    Essential hypertension -Not on occasions prior to admission    Left ICA stenosis -per duplex 2013 40-59% stenosis -no focal symptoms at presentation so no indication to repeat  duplex at this juncture    Peripheral vascular disease -Currently asymptomatic    Dyslipidemia -Continue statin -Check CPK and hepatic function panel    Adjustment disorder with mixed anxiety and depressed mood -Continue preadmission Prozac; this medication does not contribute to orthostasis    DVT Prophylaxis: Lovenox  Family Communication:   Neighbors at bedside with patient's permission; daughter currently en route from [place] Status:  Full code  Condition:  Stable  Discharge disposition:  Anticipate discharge in the next 24-48 hours pending continued resolution and orthostasis and no further episodes of syncope  Time spent in minutes : 60      Seif Teichert L. ANP on 04/09/2015 at 1:09 PM  Between 7am to 7pm - Pager - [phone]  After 7pm go to [email or web address] - password [email or web address]  And look for the night coverage person covering me after hours  [facility]

## 2015-04-10 ENCOUNTER — Encounter (HOSPITAL_COMMUNITY): Payer: Self-pay

## 2015-04-10 ENCOUNTER — Observation Stay (HOSPITAL_BASED_OUTPATIENT_CLINIC_OR_DEPARTMENT_OTHER): Payer: Medicare HMO

## 2015-04-10 DIAGNOSIS — I6522 Occlusion and stenosis of left carotid artery: Secondary | ICD-10-CM | POA: Diagnosis not present

## 2015-04-10 DIAGNOSIS — F0391 Unspecified dementia with behavioral disturbance: Secondary | ICD-10-CM | POA: Diagnosis not present

## 2015-04-10 DIAGNOSIS — I951 Orthostatic hypotension: Secondary | ICD-10-CM | POA: Diagnosis not present

## 2015-04-10 DIAGNOSIS — I10 Essential (primary) hypertension: Secondary | ICD-10-CM | POA: Diagnosis not present

## 2015-04-10 DIAGNOSIS — R55 Syncope and collapse: Secondary | ICD-10-CM

## 2015-04-10 DIAGNOSIS — I739 Peripheral vascular disease, unspecified: Secondary | ICD-10-CM

## 2015-04-10 LAB — BASIC METABOLIC PANEL
Anion gap: 6 (ref 5–15)
BUN: 9 mg/dL (ref 6–20)
CHLORIDE: 111 mmol/L (ref 101–111)
CO2: 26 mmol/L (ref 22–32)
CREATININE: 0.58 mg/dL (ref 0.44–1.00)
Calcium: 8.5 mg/dL — ABNORMAL LOW (ref 8.9–10.3)
GFR calc Af Amer: >60 mL/min (ref >60)
GFR calc non Af Amer: >60 mL/min (ref >60)
GLUCOSE: 90 mg/dL (ref 65–99)
POTASSIUM: 3.6 mmol/L (ref 3.5–5.1)
Sodium: 143 mmol/L (ref 135–145)

## 2015-04-10 LAB — GLUCOSE, CAPILLARY: Glucose-Capillary: 80 mg/dL (ref 65–99)

## 2015-04-10 LAB — CBC
HEMATOCRIT: 38.3 % (ref 36.0–46.0)
Hemoglobin: 12.8 g/dL (ref 12.0–15.0)
MCH: 29.8 pg (ref 26.0–34.0)
MCHC: 33.4 g/dL (ref 30.0–36.0)
MCV: 89.1 fL (ref 78.0–100.0)
PLATELETS: 185 10*3/uL (ref 150–400)
RBC: 4.3 MIL/uL (ref 3.87–5.11)
RDW: 13.1 % (ref 11.5–15.5)
WBC: 5 10*3/uL (ref 4.0–10.5)

## 2015-04-10 MED ORDER — LISINOPRIL 2.5 MG PO TABS: 2.5000 mg | ORAL_TABLET | Freq: Every day | ORAL | Status: DC

## 2015-04-10 MED ORDER — INFLUENZA VAC SPLIT QUAD 0.5 ML IM SUSY: 0.5000 mL | PREFILLED_SYRINGE | INTRAMUSCULAR | Status: DC

## 2015-04-10 MED ORDER — LISINOPRIL 5 MG PO TABS
2.5000 mg | ORAL_TABLET | Freq: Every day | ORAL | Status: DC
  Administered 2015-04-10: 2.5 mg via ORAL
  Filled 2015-04-10: qty 1

## 2015-04-10 MED ORDER — PNEUMOCOCCAL VAC POLYVALENT 25 MCG/0.5ML IJ INJ: 0.5000 mL | INJECTION | INTRAMUSCULAR | Status: DC

## 2015-04-10 MED ORDER — MEMANTINE HCL ER 14 MG PO CP24
14.0000 mg | ORAL_CAPSULE | Freq: Every day | ORAL | Status: DC
Start: 2015-04-10 — End: 2015-05-12

## 2015-04-10 NOTE — Evaluation (Signed)
Physical Therapy Evaluation Patient Details Name: Regina Williams MRN: [id number] DOB: June 05, 1925 Today's Date: 04/10/2015   History of Present Illness  81 year old lady with h/o mod dementia, hypertension, hyperlipidemia, PVD, presented to ED, with two witnessed syncopal episodes, associated with hypotension and orthostatics. She was admitted for evaluation and management of orthostatic hypotension with syncope.   Clinical Impression  Pt admitted with above diagnosis. Pt currently with functional limitations due to the deficits listed below (see PT Problem List). At the time of PT eval pt was able to perform transfers and ambulation with close guard. Pt did well with rollator for support, and recommend rollator for home. Pt will benefit from skilled PT to increase their independence and safety with mobility to allow discharge to the venue listed below.       Follow Up Recommendations No PT follow up;Supervision for mobility/OOB    Equipment Recommendations  Other (comment) (4 wheeled walker/rollator)    Recommendations for Other Services       Precautions / Restrictions Precautions Precautions: Fall Restrictions Weight Bearing Restrictions: No      Mobility  Bed Mobility               General bed mobility comments: Pt was sitting up in the recliner upon PT arrival.   Transfers Overall transfer level: Needs assistance Equipment used: None;Rolling walker (2 wheeled);4-wheeled walker Transfers: Sit to/from [facility] Sit to Stand: Supervision Stand pivot transfers: Min guard       General transfer comment: Pt was able to power-up to full standing without assistance. Close supervision for safety as pt was leaning backs of legs against chair for support. Pt was able to complete SPT with and without RW. Min guard for safety.   Ambulation/Gait Ambulation/Gait assistance: Min assist;Min guard;Supervision Ambulation Distance (Feet): 500 Feet Assistive  device: None;Rolling walker (2 wheeled);4-wheeled walker Gait Pattern/deviations: Step-through pattern;Decreased stride length;Trunk flexed Gait velocity: Decreased Gait velocity interpretation: Below normal speed for age/gender General Gait Details: Pt initially walking in room without AD. Unsteady and requiring min assist for balance/support. With RW, pt was able to improve to min guard and progress to a supervision level. At end of session pt tried to rollator and did well - feel the RW was too much support for her.   Stairs Stairs: Yes Stairs assistance: Min guard Stair Management: Two rails;Alternating pattern;Forwards Number of Stairs: 5 General stair comments: No physical assist. Close guard for safety.   Wheelchair Mobility    Modified Rankin (Stroke Patients Only)       Balance Overall balance assessment: Needs assistance Sitting-balance support: Feet supported;No upper extremity supported Sitting balance-Leahy Scale: Good     Standing balance support: No upper extremity supported;During functional activity Standing balance-Leahy Scale: Poor Standing balance comment: Occasional assist required.                              Pertinent Vitals/Pain Pain Assessment: 0-10 Pain Score: 0-No pain    Home Living Family/patient expects to be discharged to:: Private residence Living Arrangements: Alone Available Help at Discharge: Family;Available PRN/intermittently Type of Home: House Home Access: Stairs to enter Entrance Stairs-Rails: Right;Left;Can reach both (in the back) Entrance Stairs-Number of Steps: 5 in the back (where she parks her car) or 1 in the front Home Layout: One level Home Equipment: Cane - single point Additional Comments: Per pt daughter lives close and is available to stay with her 24 hours  at least the first day or so if needed.     Prior Function Level of Independence: Independent         Comments: Pt reports she has the [facility] at  home but was not using it lately. Denies falls at home.      Hand Dominance   Dominant Hand: Right    Extremity/Trunk Assessment   Upper Extremity Assessment: Defer to OT evaluation           Lower Extremity Assessment: Generalized weakness      Cervical / Trunk Assessment: Kyphotic  Communication   Communication: No difficulties  Cognition Arousal/Alertness: Awake/alert Behavior During Therapy: WFL for tasks assessed/performed Overall Cognitive Status: History of cognitive impairments - at baseline (Grossly WFL. Occasionally needed redirection due to dementia)       Memory: Decreased short-term memory              General Comments      Exercises        Assessment/Plan    PT Assessment Patient needs continued PT services  PT Diagnosis Difficulty walking;Generalized weakness   PT Problem List Decreased strength;Decreased range of motion;Decreased activity tolerance;Decreased balance;Decreased mobility;Decreased knowledge of use of DME;Decreased safety awareness;Decreased knowledge of precautions;Cardiopulmonary status limiting activity  PT Treatment Interventions DME instruction;Gait training;Stair training;Functional mobility training;Therapeutic activities;Therapeutic exercise;Neuromuscular re-education;Patient/family education   PT Goals (Current goals can be found in the Care Plan section) Acute Rehab PT Goals Patient Stated Goal: Home today PT Goal Formulation: With patient Time For Goal Achievement: 04/17/15 Potential to Achieve Goals: Good    Frequency Min 3X/week   Barriers to discharge        Co-evaluation               End of Session Equipment Utilized During Treatment: Gait belt Activity Tolerance: Patient tolerated treatment well Patient left: in chair;with call bell/phone within reach;Other (comment) (Transport present) Nurse Communication: Mobility status;Other (comment) (Orthostatics results)    Functional Assessment Tool  Used: Clinical judgement Functional Limitation: Mobility: Walking and moving around Mobility: Walking and Moving Around Current Status [phone]): At least 1 percent but less than 20 percent impaired, limited or restricted Mobility: Walking and Moving Around Goal Status [phone]): At least 1 percent but less than 20 percent impaired, limited or restricted    Time: 0928-1007 PT Time Calculation (min) (ACUTE ONLY): 39 min   Charges:   PT Evaluation $Initial PT Evaluation Tier I: 1 Procedure PT Treatments $Gait Training: 8-22 mins $Therapeutic Activity: 8-22 mins   PT G Codes:   PT G-Codes **NOT FOR INPATIENT CLASS** Functional Assessment Tool Used: Clinical judgement Functional Limitation: Mobility: Walking and moving around Mobility: Walking and Moving Around Current Status (R7408): At least 1 percent but less than 20 percent impaired, limited or restricted Mobility: Walking and Moving Around Goal Status [phone]): At least 1 percent but less than 20 percent impaired, limited or restricted    Rolinda Roan 04/10/2015, 11:57 AM   Rolinda Roan, PT, DPT Acute Rehabilitation Services Pager: [phone]

## 2015-04-10 NOTE — Progress Notes (Signed)
Reviewed discharge paperwork with daughter.  PIV removed.  Prescriptions given.  Pt taken to discharge location via wheelchair.

## 2015-04-10 NOTE — Discharge Summary (Signed)
Physician Discharge Summary  Regina Williams [id number] DOB: [birth year] DOA: 04/09/2015  PCP: Walker Kehr, MD  Admit date: 04/09/2015 Discharge date: 04/10/2015  Time spent: 20 minutes  Recommendations for Outpatient Follow-up:  1. Follow up with PCP as scheduled  Discharge Diagnoses:  Principal Problem:   Orthostatic syncope Active Problems:   Dyslipidemia   Essential hypertension   Asymptomatic stenosis of left carotid artery   Peripheral vascular disease   Adjustment disorder with mixed anxiety and depressed mood   Dementia   Discharge Condition: Improved  Diet recommendation: regular  Filed Weights   04/09/15 1515 04/10/15 0500  Weight: 43.455 kg (95 lb 12.8 oz) 44.2 kg (97 lb 7.1 oz)    History of present illness:  Please see admit h and p from 9/15 for details. Briefly, pt presented with syncope and found to be orthostatic. The patient was admitted for further work up.  Hospital Course:  The patient was admitted to the floor. Patient was continued on IVF hydration overnight. Pt's home memantidine-donepezil was stopped and namenda started instead. QHS benadryl was also stopped. By the following morning, the patient reported feeling much improved. Pt was noted to be no longer orthostatic. Echocardiogram with EF of 60%, normal wall motion, no significant AS. Carotid dopplers without stenosis. Pt noted to be mildly hypertensive with sbp in the 160's and will be discharged with low dose lisinopril to be followed up by PCP. Pt seen and evaluated by PT with no follow up recommended.  Discharge Exam: Filed Vitals:   04/10/15 1736 04/10/15 1747 04/10/15 1749 04/10/15 1750  BP: 167/70 156/61 163/62 159/63  Pulse:  75 72 76  Temp:  98.3 F (36.8 C)    TempSrc:  Oral    Resp:  16    Height:      Weight:      SpO2:  95% 96% 97%    General: awake, in nad Cardiovascular: regular, s1, s2 Respiratory: normal resp effort, no wheezing  Discharge Instructions      Medication List    STOP taking these medications        diphenhydrAMINE 25 mg capsule  Commonly known as:  BENADRYL     Memantine HCl-Donepezil HCl 14-10 MG Cp24  Commonly known as:  NAMZARIC      TAKE these medications        aspirin 81 MG tablet  Take 81 mg by mouth daily.     calcium gluconate 500 MG tablet  Take 500 mg by mouth 2 (two) times daily.     cholecalciferol 1000 UNITS tablet  Commonly known as:  VITAMIN D  Take 2,000 Units by mouth daily.     FLUoxetine 10 MG tablet  Commonly known as:  PROZAC  Take 0.5 tablets (5 mg total) by mouth daily.     hydrocortisone 25 MG suppository  Commonly known as:  ANUSOL-HC  Place 1 suppository (25 mg total) rectally at bedtime.     lisinopril 2.5 MG tablet  Commonly known as:  PRINIVIL,ZESTRIL  Take 1 tablet (2.5 mg total) by mouth daily.     loratadine 10 MG tablet  Commonly known as:  CLARITIN  Take 1 tablet (10 mg total) by mouth daily.     LORazepam 1 MG tablet  Commonly known as:  ATIVAN  Take 1 tablet (1 mg total) by mouth 2 (two) times daily as needed for anxiety.     lovastatin 20 MG tablet  Commonly known as:  MEVACOR  TAKE ONE  TABLET BY MOUTH EVERY DAY AT BEDTIME     meloxicam 7.5 MG tablet  Commonly known as:  MOBIC  TAKE ONE TABLET BY MOUTH ONCE DAILY FOR ONE WEEK, THEN AS NEEDED     memantine 14 MG Cp24 24 hr capsule  Commonly known as:  NAMENDA XR  Take 1 capsule (14 mg total) by mouth daily.     SYSTANE PRESERVATIVE FREE 0.4-0.3 % Soln  Generic drug:  Polyethyl Glycol-Propyl Glycol  Place 1 drop into both eyes daily as needed (dry eyes).       No Known Allergies Follow-up Information    Follow up with Walker Kehr, MD.   Specialty:  Internal Medicine   Why:  Follow up as scheduled already   Contact information:   [place]  [phone]        The results of significant diagnostics from this hospitalization (including imaging, microbiology, ancillary  and laboratory) are listed below for reference.    Significant Diagnostic Studies: Dg Chest 2 View  04/09/2015   CLINICAL DATA:  Syncope.  Dizzy.  Week.  EXAM: CHEST  2 VIEW  COMPARISON:  09/30/2005  FINDINGS: Ill-defined nodular opacities of developed in the right upper lobe lungs are otherwise clear. Normal heart size. Hyperaeration. No pneumothorax.  IMPRESSION: New right upper lobe nodular densities.  CT is recommended.   Electronically Signed   By: Marybelle Killings M.D.   On: 04/09/2015 13:24   Ct Head Wo Contrast  04/09/2015   CLINICAL DATA:  Orthostatic syncope  EXAM: CT HEAD WITHOUT CONTRAST  TECHNIQUE: Contiguous axial images were obtained from the base of the skull through the vertex without intravenous contrast.  COMPARISON:  None.  FINDINGS: Air-fluid level left maxillary sinus. Calvarium intact. No intracranial hemorrhage or extra-axial fluid. Severe diffuse atrophy. Low attenuation in the deep white matter.  IMPRESSION: Age-related involutional change. Left maxillary sinusitis, possibly acute.   Electronically Signed   By: Skipper Cliche M.D.   On: 04/09/2015 14:07    Microbiology: Recent Results (from the past 240 hour(s))  Urine culture     Status: None (Preliminary result)   Collection Time: 04/09/15  1:17 PM  Result Value Ref Range Status   Specimen Description URINE, CLEAN CATCH  Final   Special Requests NONE  Final   Culture TOO YOUNG TO READ  Final   Report Status PENDING  Incomplete     Labs: Basic Metabolic Panel:  Recent Labs Lab 04/09/15 1139 04/10/15 0733  NA 144 143  K 3.6 3.6  CL 110 111  CO2 27 26  GLUCOSE 93 90  BUN 22* 9  CREATININE 0.82 0.58  CALCIUM 8.8* 8.5*   Liver Function Tests:  Recent Labs Lab 04/09/15 1445  AST 29  ALT 29  ALKPHOS 45  BILITOT 1.0  PROT 5.4*  ALBUMIN 3.6   No results for input(s): LIPASE, AMYLASE in the last 168 hours. No results for input(s): AMMONIA in the last 168 hours. CBC:  Recent Labs Lab 04/09/15 1139  04/10/15 0733  WBC 9.2 5.0  HGB 13.0 12.8  HCT 39.6 38.3  MCV 90.6 89.1  PLT 170 185   Cardiac Enzymes:  Recent Labs Lab 04/09/15 1445  CKTOTAL 89   BNP: BNP (last 3 results) No results for input(s): BNP in the last 8760 hours.  ProBNP (last 3 results) No results for input(s): PROBNP in the last 8760 hours.  CBG:  Recent Labs Lab 04/10/15 0813  GLUCAP 80  Signed:  CHIU, Orpah Melter  [facility] 04/10/2015, 6:45 PM

## 2015-04-10 NOTE — Care Management Note (Signed)
Case Management Note  Patient Details  Name: Regina Williams MRN: [id number] Date of Birth: [birth year]  Subjective/Objective:     Patient for dc today, she states she wanted a rolling walker and chose [facility].  Referral given to [facility] with [facility],  Patient 's daughter will transport her home.  No further needs.               Action/Plan:   Expected Discharge Date:                  Expected Discharge Plan:  Home/Self Care  In-House Referral:     Discharge planning Services  CM Consult  Post Acute Care Choice:  Durable Medical Equipment Choice offered to:  Patient  DME Arranged:  Walker rolling DME Agency:  [place]:    [place]:     Status of Service:  Completed, signed off  Medicare Important Message Given:    Date Medicare IM Given:    Medicare IM give by:    Date Additional Medicare IM Given:    Additional Medicare Important Message give by:     If discussed at [facility] of Stay Meetings, dates discussed:    Additional Comments:  Zenon Mayo, RN 04/10/2015, 8:30 PM

## 2015-04-10 NOTE — Progress Notes (Signed)
*  PRELIMINARY RESULTS* Vascular Ultrasound Carotid Duplex (Doppler) has been completed.  Preliminary findings: Bilateral: No significant (1-39%) ICA stenosis. Antegrade vertebral flow.    Landry Mellow, RDMS, RVT  04/10/2015, 10:36 AM

## 2015-04-10 NOTE — Progress Notes (Signed)
  Echocardiogram 2D Echocardiogram has been performed.  Beryle Beams 04/10/2015, 11:37 AM

## 2015-04-11 LAB — URINE CULTURE

## 2015-04-29 ENCOUNTER — Ambulatory Visit (INDEPENDENT_AMBULATORY_CARE_PROVIDER_SITE_OTHER): Payer: Medicare HMO | Admitting: Internal Medicine

## 2015-04-29 ENCOUNTER — Encounter: Payer: Self-pay | Admitting: Internal Medicine

## 2015-04-29 VITALS — BP 120/60 | HR 75 | Wt 95.0 lb

## 2015-04-29 DIAGNOSIS — I951 Orthostatic hypotension: Secondary | ICD-10-CM | POA: Diagnosis not present

## 2015-04-29 DIAGNOSIS — E785 Hyperlipidemia, unspecified: Secondary | ICD-10-CM

## 2015-04-29 DIAGNOSIS — F0391 Unspecified dementia with behavioral disturbance: Secondary | ICD-10-CM | POA: Diagnosis not present

## 2015-04-29 DIAGNOSIS — I10 Essential (primary) hypertension: Secondary | ICD-10-CM

## 2015-04-29 MED ORDER — LORAZEPAM 1 MG PO TABS: 1.0000 mg | ORAL_TABLET | Freq: Two times a day (BID) | ORAL | Status: DC | PRN

## 2015-04-29 MED ORDER — LISINOPRIL 2.5 MG PO TABS: 2.5000 mg | ORAL_TABLET | Freq: Every day | ORAL | Status: DC

## 2015-04-29 NOTE — Assessment & Plan Note (Signed)
Lisinopril 

## 2015-04-29 NOTE — Progress Notes (Signed)
Subjective:  Patient ID: Regina Williams, female    DOB: 1925/05/18  Age: 81 y.o. MRN: [id number]  CC: No chief complaint on file.   HPI ROONEY GLADWIN presents for HTN, OA, dyslipidemia f/up.  Hx:   Expand All Collapse All   Physician Discharge Summary  CHARLE MCLAURIN [id number]:[id number] DOB: [birth year] DOA: 04/09/2015  PCP: Walker Kehr, MD  Admit date: 04/09/2015 Discharge date: 04/10/2015  Time spent: 20 minutes  Recommendations for Outpatient Follow-up:  1. Follow up with PCP as scheduled  Discharge Diagnoses:  Principal Problem:  Orthostatic syncope Active Problems:  Dyslipidemia  Essential hypertension  Asymptomatic stenosis of left carotid artery  Peripheral vascular disease  Adjustment disorder with mixed anxiety and depressed mood  Dementia   Discharge Condition: Improved        Outpatient Prescriptions Prior to Visit  Medication Sig Dispense Refill  . aspirin 81 MG tablet Take 81 mg by mouth daily.      . calcium gluconate 500 MG tablet Take 500 mg by mouth 2 (two) times daily.      . cholecalciferol (VITAMIN D) 1000 UNITS tablet Take 2,000 Units by mouth daily.      Marland Kitchen FLUoxetine (PROZAC) 10 MG tablet Take 0.5 tablets (5 mg total) by mouth daily. 90 tablet 3  . hydrocortisone (ANUSOL-HC) 25 MG suppository Place 1 suppository (25 mg total) rectally at bedtime. (Patient taking differently: Place 25 mg rectally 2 (two) times daily as needed for hemorrhoids or itching. ) 12 suppository 0  . lisinopril (PRINIVIL,ZESTRIL) 2.5 MG tablet Take 1 tablet (2.5 mg total) by mouth daily. 30 tablet 0  . loratadine (CLARITIN) 10 MG tablet Take 1 tablet (10 mg total) by mouth daily. (Patient taking differently: Take 10 mg by mouth daily as needed for allergies, rhinitis or itching. ) 300 tablet 1  . LORazepam (ATIVAN) 1 MG tablet Take 1 tablet (1 mg total) by mouth 2 (two) times daily as needed for anxiety. (Patient taking differently: Take 1 mg by mouth 2 (two) times  daily. ) 60 tablet 3  . lovastatin (MEVACOR) 20 MG tablet TAKE ONE TABLET BY MOUTH EVERY DAY AT BEDTIME 90 tablet 3  . meloxicam (MOBIC) 7.5 MG tablet TAKE ONE TABLET BY MOUTH ONCE DAILY FOR ONE WEEK, THEN AS NEEDED 30 tablet 5  . memantine (NAMENDA XR) 14 MG CP24 24 hr capsule Take 1 capsule (14 mg total) by mouth daily. 30 capsule 0  . Polyethyl Glycol-Propyl Glycol (SYSTANE PRESERVATIVE FREE) 0.4-0.3 % SOLN Place 1 drop into both eyes daily as needed (dry eyes).      Facility-Administered Medications Prior to Visit  Medication Dose Route Frequency Provider Last Rate Last Dose  . methylPREDNISolone acetate (DEPO-MEDROL) injection 40 mg  40 mg Intra-articular Once Denetria Luevanos V, MD        ROS Review of Systems  Constitutional: Negative for chills, activity change, appetite change, fatigue and unexpected weight change.  HENT: Negative for congestion, mouth sores and sinus pressure.   Eyes: Negative for visual disturbance.  Respiratory: Negative for cough and chest tightness.   Gastrointestinal: Negative for nausea and abdominal pain.  Genitourinary: Negative for frequency, difficulty urinating and vaginal pain.  Musculoskeletal: Negative for back pain and gait problem.  Skin: Negative for pallor and rash.  Neurological: Negative for dizziness, tremors, weakness, numbness and headaches.  Psychiatric/Behavioral: Positive for confusion and decreased concentration. Negative for suicidal ideas and sleep disturbance. The patient is nervous/anxious.     Objective:  BP 120/60 mmHg  Pulse 75  Wt 95 lb (43.092 kg)  SpO2 97%  BP Readings from Last 3 Encounters:  04/29/15 120/60  04/10/15 159/63  01/27/15 122/70    Wt Readings from Last 3 Encounters:  04/29/15 95 lb (43.092 kg)  04/10/15 97 lb 7.1 oz (44.2 kg)  01/27/15 97 lb (43.999 kg)    Physical Exam  Constitutional: She appears well-developed. No distress.  HENT:  Head: Normocephalic.  Right Ear: External ear normal.    Left Ear: External ear normal.  Nose: Nose normal.  Mouth/Throat: Oropharynx is clear and moist.  Eyes: Conjunctivae are normal. Pupils are equal, round, and reactive to light. Right eye exhibits no discharge. Left eye exhibits no discharge.  Neck: Normal range of motion. Neck supple. No JVD present. No tracheal deviation present. No thyromegaly present.  Cardiovascular: Normal rate, regular rhythm and normal heart sounds.   Pulmonary/Chest: No stridor. No respiratory distress. She has no wheezes.  Abdominal: Soft. Bowel sounds are normal. She exhibits no distension and no mass. There is no tenderness. There is no rebound and no guarding.  Musculoskeletal: She exhibits no edema or tenderness.  Lymphadenopathy:    She has no cervical adenopathy.  Neurological: She displays normal reflexes. No cranial nerve deficit. She exhibits normal muscle tone. Coordination abnormal.  Skin: No rash noted. No erythema.  Psychiatric: She has a normal mood and affect. Her behavior is normal. Judgment normal.  Cane  Lab Results  Component Value Date   WBC 5.0 04/10/2015   HGB 12.8 04/10/2015   HCT 38.3 04/10/2015   PLT 185 04/10/2015   GLUCOSE 90 04/10/2015   CHOL 189 07/09/2013   TRIG 78.0 07/09/2013   HDL 81.80 07/09/2013   LDLCALC 92 07/09/2013   ALT 29 04/09/2015   AST 29 04/09/2015   NA 143 04/10/2015   K 3.6 04/10/2015   CL 111 04/10/2015   CREATININE 0.58 04/10/2015   BUN 9 04/10/2015   CO2 26 04/10/2015   TSH 1.131 04/09/2015    Dg Chest 2 View  04/09/2015   CLINICAL DATA:  Syncope.  Dizzy.  Week.  EXAM: CHEST  2 VIEW  COMPARISON:  09/30/2005  FINDINGS: Ill-defined nodular opacities of developed in the right upper lobe lungs are otherwise clear. Normal heart size. Hyperaeration. No pneumothorax.  IMPRESSION: New right upper lobe nodular densities.  CT is recommended.   Electronically Signed   By: Marybelle Killings M.D.   On: 04/09/2015 13:24   Ct Head Wo Contrast  04/09/2015   CLINICAL  DATA:  Orthostatic syncope  EXAM: CT HEAD WITHOUT CONTRAST  TECHNIQUE: Contiguous axial images were obtained from the base of the skull through the vertex without intravenous contrast.  COMPARISON:  None.  FINDINGS: Air-fluid level left maxillary sinus. Calvarium intact. No intracranial hemorrhage or extra-axial fluid. Severe diffuse atrophy. Low attenuation in the deep white matter.  IMPRESSION: Age-related involutional change. Left maxillary sinusitis, possibly acute.   Electronically Signed   By: Skipper Cliche M.D.   On: 04/09/2015 14:07    Assessment & Plan:   There are no diagnoses linked to this encounter. I am having Ms. [place] maintain her aspirin, calcium gluconate, Polyethyl Glycol-Propyl Glycol, cholecalciferol, hydrocortisone, meloxicam, loratadine, LORazepam, lovastatin, FLUoxetine, lisinopril, and memantine. We will continue to administer methylPREDNISolone acetate.  No orders of the defined types were placed in this encounter.     Follow-up: No Follow-up on file.  Walker Kehr, MD

## 2015-04-29 NOTE — Progress Notes (Signed)
Pre visit review using our clinic review tool, if applicable. No additional management support is needed unless otherwise documented below in the visit note. 

## 2015-04-29 NOTE — Assessment & Plan Note (Signed)
On Lovastatin

## 2015-04-29 NOTE — Assessment & Plan Note (Signed)
Off Namzaric 9/16 On Namenda

## 2015-04-29 NOTE — Assessment & Plan Note (Signed)
No relapse 

## 2015-05-12 ENCOUNTER — Telehealth: Payer: Self-pay | Admitting: *Deleted

## 2015-05-12 MED ORDER — LISINOPRIL 2.5 MG PO TABS: 2.5000 mg | ORAL_TABLET | Freq: Every day | ORAL | Status: AC

## 2015-05-12 MED ORDER — MEMANTINE HCL ER 14 MG PO CP24: 14.0000 mg | ORAL_CAPSULE | Freq: Every day | ORAL | Status: DC

## 2015-05-12 NOTE — Telephone Encounter (Signed)
Receive call daughter states mom is needing refills on her Lisinopril & Namenda. Verified pharmacy sent to [place]...Johny Chess

## 2015-06-22 ENCOUNTER — Other Ambulatory Visit: Payer: Self-pay | Admitting: Internal Medicine

## 2015-06-24 NOTE — Telephone Encounter (Signed)
Patient needs refill on lorazepam 1mg  sent to [facility] in [place]

## 2015-06-25 ENCOUNTER — Other Ambulatory Visit: Payer: Self-pay | Admitting: Internal Medicine

## 2015-06-25 NOTE — Telephone Encounter (Signed)
Done

## 2015-07-31 ENCOUNTER — Ambulatory Visit (INDEPENDENT_AMBULATORY_CARE_PROVIDER_SITE_OTHER): Payer: Medicare HMO | Admitting: Internal Medicine

## 2015-07-31 ENCOUNTER — Encounter: Payer: Self-pay | Admitting: Internal Medicine

## 2015-07-31 VITALS — BP 124/74 | HR 84 | Wt 92.0 lb

## 2015-07-31 DIAGNOSIS — E785 Hyperlipidemia, unspecified: Secondary | ICD-10-CM | POA: Diagnosis not present

## 2015-07-31 DIAGNOSIS — I10 Essential (primary) hypertension: Secondary | ICD-10-CM

## 2015-07-31 DIAGNOSIS — F0390 Unspecified dementia without behavioral disturbance: Secondary | ICD-10-CM | POA: Diagnosis not present

## 2015-07-31 NOTE — Progress Notes (Signed)
Subjective:  Patient ID: Regina Williams, female    DOB: 05/10/1925  Age: 82 y.o. MRN: [id number]  CC: No chief complaint on file.   HPI ENEDINA KOVASH presents for dementia, depression, HTN, dyslipidemia f/u. Comes w/dtr Coralyn Mark  Outpatient Prescriptions Prior to Visit  Medication Sig Dispense Refill  . aspirin 81 MG tablet Take 81 mg by mouth daily.      . calcium gluconate 500 MG tablet Take 500 mg by mouth 2 (two) times daily.      . cholecalciferol (VITAMIN D) 1000 UNITS tablet Take 2,000 Units by mouth daily.      Marland Kitchen FLUoxetine (PROZAC) 10 MG tablet Take 0.5 tablets (5 mg total) by mouth daily. 90 tablet 3  . hydrocortisone (ANUSOL-HC) 25 MG suppository Place 1 suppository (25 mg total) rectally at bedtime. 12 suppository 0  . lisinopril (PRINIVIL,ZESTRIL) 2.5 MG tablet Take 1 tablet (2.5 mg total) by mouth daily. 90 tablet 3  . loratadine (CLARITIN) 10 MG tablet Take 1 tablet (10 mg total) by mouth daily. (Patient taking differently: Take 10 mg by mouth daily as needed for allergies, rhinitis or itching. ) 300 tablet 1  . LORazepam (ATIVAN) 1 MG tablet TAKE ONE TABLET BY MOUTH TWICE DAILY AS NEEDED FOR ANXIETY 60 tablet 3  . lovastatin (MEVACOR) 20 MG tablet TAKE ONE TABLET BY MOUTH EVERY DAY AT BEDTIME 90 tablet 3  . memantine (NAMENDA XR) 14 MG CP24 24 hr capsule Take 1 capsule (14 mg total) by mouth daily. 90 capsule 3  . Polyethyl Glycol-Propyl Glycol (SYSTANE PRESERVATIVE FREE) 0.4-0.3 % SOLN Place 1 drop into both eyes daily as needed (dry eyes).     . meloxicam (MOBIC) 7.5 MG tablet TAKE ONE TABLET BY MOUTH ONCE DAILY FOR ONE WEEK, THEN AS NEEDED (Patient not taking: Reported on 07/31/2015) 30 tablet 5   Facility-Administered Medications Prior to Visit  Medication Dose Route Frequency Provider Last Rate Last Dose  . methylPREDNISolone acetate (DEPO-MEDROL) injection 40 mg  40 mg Intra-articular Once Hammad Finkler V, MD        ROS Review of Systems  Constitutional: Negative  for chills, activity change, appetite change, fatigue and unexpected weight change.  HENT: Negative for congestion, mouth sores and sinus pressure.   Eyes: Negative for visual disturbance.  Respiratory: Negative for cough and chest tightness.   Gastrointestinal: Negative for nausea and abdominal pain.  Genitourinary: Negative for frequency, difficulty urinating and vaginal pain.  Musculoskeletal: Negative for back pain and gait problem.  Skin: Negative for pallor and rash.  Neurological: Negative for dizziness, tremors, weakness, numbness and headaches.  Psychiatric/Behavioral: Positive for decreased concentration. Negative for confusion and sleep disturbance.    Objective:  BP 124/74 mmHg  Pulse 84  Wt 92 lb (41.731 kg)  SpO2 93%  BP Readings from Last 3 Encounters:  07/31/15 124/74  04/29/15 120/60  04/10/15 159/63    Wt Readings from Last 3 Encounters:  07/31/15 92 lb (41.731 kg)  04/29/15 95 lb (43.092 kg)  04/10/15 97 lb 7.1 oz (44.2 kg)    Physical Exam  Constitutional: She appears well-developed. No distress.  HENT:  Head: Normocephalic.  Right Ear: External ear normal.  Left Ear: External ear normal.  Nose: Nose normal.  Mouth/Throat: Oropharynx is clear and moist.  Eyes: Conjunctivae are normal. Pupils are equal, round, and reactive to light. Right eye exhibits no discharge. Left eye exhibits no discharge.  Neck: Normal range of motion. Neck supple. No JVD present. No  tracheal deviation present. No thyromegaly present.  Cardiovascular: Normal rate, regular rhythm and normal heart sounds.   Pulmonary/Chest: No stridor. No respiratory distress. She has no wheezes.  Abdominal: Soft. Bowel sounds are normal. She exhibits no distension and no mass. There is no tenderness. There is no rebound and no guarding.  Musculoskeletal: She exhibits no edema or tenderness.  Lymphadenopathy:    She has no cervical adenopathy.  Neurological: She displays normal reflexes. No  cranial nerve deficit. She exhibits normal muscle tone. Coordination abnormal.  Skin: No rash noted. No erythema.  Psychiatric: She has a normal mood and affect. Her behavior is normal.  alert, cooperative  Lab Results  Component Value Date   WBC 5.0 04/10/2015   HGB 12.8 04/10/2015   HCT 38.3 04/10/2015   PLT 185 04/10/2015   GLUCOSE 90 04/10/2015   CHOL 189 07/09/2013   TRIG 78.0 07/09/2013   HDL 81.80 07/09/2013   LDLCALC 92 07/09/2013   ALT 29 04/09/2015   AST 29 04/09/2015   NA 143 04/10/2015   K 3.6 04/10/2015   CL 111 04/10/2015   CREATININE 0.58 04/10/2015   BUN 9 04/10/2015   CO2 26 04/10/2015   TSH 1.131 04/09/2015    Dg Chest 2 View  04/09/2015  CLINICAL DATA:  Syncope.  Dizzy.  Week. EXAM: CHEST  2 VIEW COMPARISON:  09/30/2005 FINDINGS: Ill-defined nodular opacities of developed in the right upper lobe lungs are otherwise clear. Normal heart size. Hyperaeration. No pneumothorax. IMPRESSION: New right upper lobe nodular densities.  CT is recommended. Electronically Signed   By: Marybelle Killings M.D.   On: 04/09/2015 13:24   Ct Head Wo Contrast  04/09/2015  CLINICAL DATA:  Orthostatic syncope EXAM: CT HEAD WITHOUT CONTRAST TECHNIQUE: Contiguous axial images were obtained from the base of the skull through the vertex without intravenous contrast. COMPARISON:  None. FINDINGS: Air-fluid level left maxillary sinus. Calvarium intact. No intracranial hemorrhage or extra-axial fluid. Severe diffuse atrophy. Low attenuation in the deep white matter. IMPRESSION: Age-related involutional change. Left maxillary sinusitis, possibly acute. Electronically Signed   By: Skipper Cliche M.D.   On: 04/09/2015 14:07    Assessment & Plan:   Diagnoses and all orders for this visit:  Dementia, without behavioral disturbance -     Hepatic function panel; Future -     Basic metabolic panel; Future -     TSH; Future  Essential hypertension -     Hepatic function panel; Future -     Basic  metabolic panel; Future -     TSH; Future   I am having Ms. Ghazal maintain her aspirin, calcium gluconate, Polyethyl Glycol-Propyl Glycol, cholecalciferol, hydrocortisone, meloxicam, loratadine, lovastatin, FLUoxetine, lisinopril, memantine, and LORazepam. We will continue to administer methylPREDNISolone acetate.  No orders of the defined types were placed in this encounter.     Follow-up: Return in about 4 months (around 11/28/2015) for a follow-up visit.  Walker Kehr, MD

## 2015-07-31 NOTE — Assessment & Plan Note (Signed)
Lisinopril 

## 2015-07-31 NOTE — Progress Notes (Signed)
Pre visit review using our clinic review tool, if applicable. No additional management support is needed unless otherwise documented below in the visit note. 

## 2015-07-31 NOTE — Assessment & Plan Note (Signed)
On Namenda 

## 2015-07-31 NOTE — Assessment & Plan Note (Signed)
On Lovastatin

## 2015-11-27 ENCOUNTER — Other Ambulatory Visit (INDEPENDENT_AMBULATORY_CARE_PROVIDER_SITE_OTHER): Payer: Medicare HMO

## 2015-11-27 ENCOUNTER — Encounter: Payer: Self-pay | Admitting: Internal Medicine

## 2015-11-27 ENCOUNTER — Ambulatory Visit (INDEPENDENT_AMBULATORY_CARE_PROVIDER_SITE_OTHER): Payer: Medicare HMO | Admitting: Internal Medicine

## 2015-11-27 VITALS — BP 120/70 | HR 80 | Wt 95.0 lb

## 2015-11-27 DIAGNOSIS — I10 Essential (primary) hypertension: Secondary | ICD-10-CM | POA: Diagnosis not present

## 2015-11-27 DIAGNOSIS — F411 Generalized anxiety disorder: Secondary | ICD-10-CM | POA: Diagnosis not present

## 2015-11-27 DIAGNOSIS — F0390 Unspecified dementia without behavioral disturbance: Secondary | ICD-10-CM | POA: Diagnosis not present

## 2015-11-27 DIAGNOSIS — R69 Illness, unspecified: Secondary | ICD-10-CM | POA: Diagnosis not present

## 2015-11-27 DIAGNOSIS — I6522 Occlusion and stenosis of left carotid artery: Secondary | ICD-10-CM | POA: Diagnosis not present

## 2015-11-27 LAB — BASIC METABOLIC PANEL
BUN: 24 mg/dL — ABNORMAL HIGH (ref 6–23)
CO2: 31 mEq/L (ref 19–32)
Calcium: 9.4 mg/dL (ref 8.4–10.5)
Chloride: 102 mEq/L (ref 96–112)
Creatinine, Ser: 0.66 mg/dL (ref 0.40–1.20)
GFR: 89.73 mL/min (ref >60.00)
Glucose, Bld: 91 mg/dL (ref 70–99)
POTASSIUM: 4.5 meq/L (ref 3.5–5.1)
SODIUM: 140 meq/L (ref 135–145)

## 2015-11-27 LAB — HEPATIC FUNCTION PANEL
ALBUMIN: 4.2 g/dL (ref 3.5–5.2)
ALT: 21 U/L (ref 0–35)
AST: 21 U/L (ref 0–37)
Alkaline Phosphatase: 56 U/L (ref 39–117)
BILIRUBIN TOTAL: 0.6 mg/dL (ref 0.2–1.2)
Bilirubin, Direct: 0.1 mg/dL (ref 0.0–0.3)
TOTAL PROTEIN: 6.6 g/dL (ref 6.0–8.3)

## 2015-11-27 LAB — TSH: TSH: 2.2 u[IU]/mL (ref 0.35–4.50)

## 2015-11-27 NOTE — Progress Notes (Signed)
Subjective:  Patient ID: Regina Williams, female    DOB: 05/25/1925  Age: 82 y.o. MRN: [id number]  CC: No chief complaint on file.   HPI Regina Williams presents for HTN, depression, dementia f/u  Outpatient Prescriptions Prior to Visit  Medication Sig Dispense Refill  . aspirin 81 MG tablet Take 81 mg by mouth daily.      . calcium gluconate 500 MG tablet Take 500 mg by mouth 2 (two) times daily.      . cholecalciferol (VITAMIN D) 1000 UNITS tablet Take 2,000 Units by mouth daily.      Marland Kitchen FLUoxetine (PROZAC) 10 MG tablet Take 0.5 tablets (5 mg total) by mouth daily. 90 tablet 3  . hydrocortisone (ANUSOL-HC) 25 MG suppository Place 1 suppository (25 mg total) rectally at bedtime. 12 suppository 0  . lisinopril (PRINIVIL,ZESTRIL) 2.5 MG tablet Take 1 tablet (2.5 mg total) by mouth daily. 90 tablet 3  . loratadine (CLARITIN) 10 MG tablet Take 1 tablet (10 mg total) by mouth daily. (Patient taking differently: Take 10 mg by mouth daily as needed for allergies, rhinitis or itching. ) 300 tablet 1  . LORazepam (ATIVAN) 1 MG tablet TAKE ONE TABLET BY MOUTH TWICE DAILY AS NEEDED FOR ANXIETY 60 tablet 3  . lovastatin (MEVACOR) 20 MG tablet TAKE ONE TABLET BY MOUTH EVERY DAY AT BEDTIME 90 tablet 3  . meloxicam (MOBIC) 7.5 MG tablet TAKE ONE TABLET BY MOUTH ONCE DAILY FOR ONE WEEK, THEN AS NEEDED 30 tablet 5  . memantine (NAMENDA XR) 14 MG CP24 24 hr capsule Take 1 capsule (14 mg total) by mouth daily. 90 capsule 3  . Polyethyl Glycol-Propyl Glycol (SYSTANE PRESERVATIVE FREE) 0.4-0.3 % SOLN Place 1 drop into both eyes daily as needed (dry eyes).      Facility-Administered Medications Prior to Visit  Medication Dose Route Frequency Provider Last Rate Last Dose  . methylPREDNISolone acetate (DEPO-MEDROL) injection 40 mg  40 mg Intra-articular Once Regina Senne V, MD        ROS Review of Systems  Constitutional: Negative for chills, activity change, appetite change, fatigue and unexpected weight  change.  HENT: Negative for congestion, mouth sores and sinus pressure.   Eyes: Negative for visual disturbance.  Respiratory: Negative for cough and chest tightness.   Gastrointestinal: Negative for nausea and abdominal pain.  Genitourinary: Negative for frequency, difficulty urinating and vaginal pain.  Musculoskeletal: Positive for arthralgias and gait problem. Negative for back pain.  Skin: Negative for pallor and rash.  Neurological: Negative for dizziness, tremors, weakness, numbness and headaches.  Psychiatric/Behavioral: Positive for decreased concentration. Negative for confusion and sleep disturbance.    Objective:  BP 120/70 mmHg  Pulse 80  Wt 95 lb (43.092 kg)  SpO2 97%  BP Readings from Last 3 Encounters:  11/27/15 120/70  07/31/15 124/74  04/29/15 120/60    Wt Readings from Last 3 Encounters:  11/27/15 95 lb (43.092 kg)  07/31/15 92 lb (41.731 kg)  04/29/15 95 lb (43.092 kg)    Physical Exam  Constitutional: She appears well-developed. No distress.  HENT:  Head: Normocephalic.  Right Ear: External ear normal.  Left Ear: External ear normal.  Nose: Nose normal.  Mouth/Throat: Oropharynx is clear and moist.  Eyes: Conjunctivae are normal. Pupils are equal, round, and reactive to light. Right eye exhibits no discharge. Left eye exhibits no discharge.  Neck: Normal range of motion. Neck supple. No JVD present. No tracheal deviation present. No thyromegaly present.  Cardiovascular: Normal  rate, regular rhythm and normal heart sounds.   Pulmonary/Chest: No stridor. No respiratory distress. She has no wheezes.  Abdominal: Soft. Bowel sounds are normal. She exhibits no distension and no mass. There is no tenderness. There is no rebound and no guarding.  Musculoskeletal: She exhibits no edema or tenderness.  Lymphadenopathy:    She has no cervical adenopathy.  Neurological: She displays normal reflexes. No cranial nerve deficit. She exhibits normal muscle tone.  Coordination abnormal.  Skin: No rash noted. No erythema.  Psychiatric: She has a normal mood and affect. Her behavior is normal. Judgment and thought content normal.   Cane   Lab Results  Component Value Date   WBC 5.0 04/10/2015   HGB 12.8 04/10/2015   HCT 38.3 04/10/2015   PLT 185 04/10/2015   GLUCOSE 90 04/10/2015   CHOL 189 07/09/2013   TRIG 78.0 07/09/2013   HDL 81.80 07/09/2013   LDLCALC 92 07/09/2013   ALT 29 04/09/2015   AST 29 04/09/2015   NA 143 04/10/2015   K 3.6 04/10/2015   CL 111 04/10/2015   CREATININE 0.58 04/10/2015   BUN 9 04/10/2015   CO2 26 04/10/2015   TSH 1.131 04/09/2015    Dg Chest 2 View  04/09/2015  CLINICAL DATA:  Syncope.  Dizzy.  Week. EXAM: CHEST  2 VIEW COMPARISON:  09/30/2005 FINDINGS: Ill-defined nodular opacities of developed in the right upper lobe lungs are otherwise clear. Normal heart size. Hyperaeration. No pneumothorax. IMPRESSION: New right upper lobe nodular densities.  CT is recommended. Electronically Signed   By: Regina Williams M.D.   On: 04/09/2015 13:24   Ct Head Wo Contrast  04/09/2015  CLINICAL DATA:  Orthostatic syncope EXAM: CT HEAD WITHOUT CONTRAST TECHNIQUE: Contiguous axial images were obtained from the base of the skull through the vertex without intravenous contrast. COMPARISON:  None. FINDINGS: Air-fluid level left maxillary sinus. Calvarium intact. No intracranial hemorrhage or extra-axial fluid. Severe diffuse atrophy. Low attenuation in the deep white matter. IMPRESSION: Age-related involutional change. Left maxillary sinusitis, possibly acute. Electronically Signed   By: Regina Williams M.D.   On: 04/09/2015 14:07    Assessment & Plan:   There are no diagnoses linked to this encounter. I am having Regina Williams maintain her aspirin, calcium gluconate, Polyethyl Glycol-Propyl Glycol, cholecalciferol, hydrocortisone, meloxicam, loratadine, lovastatin, FLUoxetine, lisinopril, memantine, and LORazepam. We will continue to  administer methylPREDNISolone acetate.  No orders of the defined types were placed in this encounter.     Follow-up: No Follow-up on file.  Regina Kehr, MD

## 2015-11-27 NOTE — Assessment & Plan Note (Signed)
Lisinopril 

## 2015-11-27 NOTE — Assessment & Plan Note (Signed)
On Namenda 

## 2015-11-27 NOTE — Assessment & Plan Note (Signed)
Lovastatin, ASA 81

## 2015-11-27 NOTE — Assessment & Plan Note (Signed)
On Lorazepam prn Prozac qd 

## 2015-11-27 NOTE — Progress Notes (Signed)
Pre visit review using our clinic review tool, if applicable. No additional management support is needed unless otherwise documented below in the visit note. 

## 2016-01-08 ENCOUNTER — Other Ambulatory Visit: Payer: Self-pay | Admitting: Internal Medicine

## 2016-01-12 NOTE — Telephone Encounter (Signed)
Called to [facility] 

## 2016-02-11 ENCOUNTER — Other Ambulatory Visit: Payer: Self-pay | Admitting: Internal Medicine

## 2016-03-16 ENCOUNTER — Encounter: Payer: Self-pay | Admitting: Gastroenterology

## 2016-03-30 ENCOUNTER — Encounter: Payer: Self-pay | Admitting: Internal Medicine

## 2016-03-30 ENCOUNTER — Ambulatory Visit (INDEPENDENT_AMBULATORY_CARE_PROVIDER_SITE_OTHER): Payer: Medicare HMO | Admitting: Internal Medicine

## 2016-03-30 VITALS — BP 110/60 | HR 82 | Temp 97.8°F | Wt 94.0 lb

## 2016-03-30 DIAGNOSIS — I10 Essential (primary) hypertension: Secondary | ICD-10-CM | POA: Diagnosis not present

## 2016-03-30 DIAGNOSIS — E785 Hyperlipidemia, unspecified: Secondary | ICD-10-CM

## 2016-03-30 DIAGNOSIS — F0390 Unspecified dementia without behavioral disturbance: Secondary | ICD-10-CM

## 2016-03-30 DIAGNOSIS — R69 Illness, unspecified: Secondary | ICD-10-CM | POA: Diagnosis not present

## 2016-03-30 DIAGNOSIS — F411 Generalized anxiety disorder: Secondary | ICD-10-CM | POA: Diagnosis not present

## 2016-03-30 DIAGNOSIS — Z23 Encounter for immunization: Secondary | ICD-10-CM | POA: Diagnosis not present

## 2016-03-30 DIAGNOSIS — I739 Peripheral vascular disease, unspecified: Secondary | ICD-10-CM

## 2016-03-30 MED ORDER — LOVASTATIN 20 MG PO TABS: ORAL_TABLET | ORAL | 3 refills | Status: DC

## 2016-03-30 NOTE — Assessment & Plan Note (Signed)
On Lorazepam prn Prozac qd 

## 2016-03-30 NOTE — Addendum Note (Signed)
Addended by: Cresenciano Lick on: 03/30/2016 02:04 PM   Modules accepted: Orders

## 2016-03-30 NOTE — Assessment & Plan Note (Signed)
Lisinopril 

## 2016-03-30 NOTE — Progress Notes (Addendum)
Subjective:  Patient ID: Regina Williams, female    DOB: [birth year]  Age: 82 y.o. MRN: [id number]  CC: No chief complaint on file.   HPI NEDDA FRIPP presents for anxiety, OA, HTN, memory loss f/u  Outpatient Medications Prior to Visit  Medication Sig Dispense Refill  . aspirin 81 MG tablet Take 81 mg by mouth daily.      . calcium gluconate 500 MG tablet Take 500 mg by mouth 2 (two) times daily.      . cholecalciferol (VITAMIN D) 1000 UNITS tablet Take 2,000 Units by mouth daily.      Marland Kitchen FLUoxetine (PROZAC) 10 MG tablet GIVE ONE-HALF TABLET BY MOUTH ONCE DAILY 90 tablet 0  . hydrocortisone (ANUSOL-HC) 25 MG suppository Place 1 suppository (25 mg total) rectally at bedtime. 12 suppository 0  . lisinopril (PRINIVIL,ZESTRIL) 2.5 MG tablet Take 1 tablet (2.5 mg total) by mouth daily. 90 tablet 3  . loratadine (CLARITIN) 10 MG tablet Take 1 tablet (10 mg total) by mouth daily. (Patient taking differently: Take 10 mg by mouth daily as needed for allergies, rhinitis or itching. ) 300 tablet 1  . LORazepam (ATIVAN) 1 MG tablet TAKE ONE TABLET BY MOUTH TWICE DAILY AS NEEDED 60 tablet 2  . lovastatin (MEVACOR) 20 MG tablet TAKE ONE TABLET BY MOUTH EVERY DAY AT BEDTIME 90 tablet 3  . meloxicam (MOBIC) 7.5 MG tablet TAKE ONE TABLET BY MOUTH ONCE DAILY FOR ONE WEEK, THEN AS NEEDED 30 tablet 5  . memantine (NAMENDA XR) 14 MG CP24 24 hr capsule Take 1 capsule (14 mg total) by mouth daily. 90 capsule 3  . Polyethyl Glycol-Propyl Glycol (SYSTANE PRESERVATIVE FREE) 0.4-0.3 % SOLN Place 1 drop into both eyes daily as needed (dry eyes).      Facility-Administered Medications Prior to Visit  Medication Dose Route Frequency Provider Last Rate Last Dose  . methylPREDNISolone acetate (DEPO-MEDROL) injection 40 mg  40 mg Intra-articular Once Cassandria Anger, MD        ROS Review of Systems  Constitutional: Negative for activity change, appetite change, chills, fatigue and unexpected weight change.  HENT:  Negative for congestion, mouth sores and sinus pressure.   Eyes: Negative for visual disturbance.  Respiratory: Negative for cough and chest tightness.   Gastrointestinal: Negative for abdominal pain and nausea.  Genitourinary: Negative for difficulty urinating, frequency and vaginal pain.  Musculoskeletal: Negative for back pain and gait problem.  Skin: Negative for pallor and rash.  Neurological: Negative for dizziness, tremors, weakness, numbness and headaches.  Psychiatric/Behavioral: Positive for confusion and decreased concentration. Negative for sleep disturbance and suicidal ideas. The patient is nervous/anxious.     Objective:  BP 110/60   Pulse 82   Temp 97.8 F (36.6 C) (Oral)   Wt 94 lb (42.6 kg)   SpO2 98%   BMI 17.76 kg/m   BP Readings from Last 3 Encounters:  03/30/16 110/60  11/27/15 120/70  07/31/15 124/74    Wt Readings from Last 3 Encounters:  03/30/16 94 lb (42.6 kg)  11/27/15 95 lb (43.1 kg)  07/31/15 92 lb (41.7 kg)    Physical Exam  Constitutional: She appears well-developed. No distress.  HENT:  Head: Normocephalic.  Right Ear: External ear normal.  Left Ear: External ear normal.  Nose: Nose normal.  Mouth/Throat: Oropharynx is clear and moist.  Eyes: Conjunctivae are normal. Pupils are equal, round, and reactive to light. Right eye exhibits no discharge. Left eye exhibits no discharge.  Neck:  Normal range of motion. Neck supple. No JVD present. No tracheal deviation present. No thyromegaly present.  Cardiovascular: Normal rate, regular rhythm and normal heart sounds.   Pulmonary/Chest: No stridor. No respiratory distress. She has no wheezes.  Abdominal: Soft. Bowel sounds are normal. She exhibits no distension and no mass. There is no tenderness. There is no rebound and no guarding.  Musculoskeletal: She exhibits no edema or tenderness.  Lymphadenopathy:    She has no cervical adenopathy.  Neurological: She displays normal reflexes. No cranial  nerve deficit. She exhibits normal muscle tone. Coordination normal.  Skin: No rash noted. No erythema.  Psychiatric: She has a normal mood and affect. Her behavior is normal. Thought content normal.  Ataxic Alert, cooperative  Lab Results  Component Value Date   WBC 5.0 04/10/2015   HGB 12.8 04/10/2015   HCT 38.3 04/10/2015   PLT 185 04/10/2015   GLUCOSE 91 11/27/2015   CHOL 189 07/09/2013   TRIG 78.0 07/09/2013   HDL 81.80 07/09/2013   LDLCALC 92 07/09/2013   ALT 21 11/27/2015   AST 21 11/27/2015   NA 140 11/27/2015   K 4.5 11/27/2015   CL 102 11/27/2015   CREATININE 0.66 11/27/2015   BUN 24 (H) 11/27/2015   CO2 31 11/27/2015   TSH 2.20 11/27/2015    Dg Chest 2 View  Result Date: 04/09/2015 CLINICAL DATA:  Syncope.  Dizzy.  Week. EXAM: CHEST  2 VIEW COMPARISON:  09/30/2005 FINDINGS: Ill-defined nodular opacities of developed in the right upper lobe lungs are otherwise clear. Normal heart size. Hyperaeration. No pneumothorax. IMPRESSION: New right upper lobe nodular densities.  CT is recommended. Electronically Signed   By: Marybelle Killings M.D.   On: 04/09/2015 13:24   Ct Head Wo Contrast  Result Date: 04/09/2015 CLINICAL DATA:  Orthostatic syncope EXAM: CT HEAD WITHOUT CONTRAST TECHNIQUE: Contiguous axial images were obtained from the base of the skull through the vertex without intravenous contrast. COMPARISON:  None. FINDINGS: Air-fluid level left maxillary sinus. Calvarium intact. No intracranial hemorrhage or extra-axial fluid. Severe diffuse atrophy. Low attenuation in the deep white matter. IMPRESSION: Age-related involutional change. Left maxillary sinusitis, possibly acute. Electronically Signed   By: Skipper Cliche M.D.   On: 04/09/2015 14:07    Assessment & Plan:   There are no diagnoses linked to this encounter. I am having Ms. Calzadilla maintain her aspirin, calcium gluconate, Polyethyl Glycol-Propyl Glycol, cholecalciferol, hydrocortisone, meloxicam, loratadine,  lovastatin, lisinopril, memantine, LORazepam, and FLUoxetine. We will continue to administer methylPREDNISolone acetate.  No orders of the defined types were placed in this encounter.    Follow-up: No Follow-up on file.  Walker Kehr, MD

## 2016-03-30 NOTE — Assessment & Plan Note (Signed)
On Namenda 

## 2016-03-30 NOTE — Assessment & Plan Note (Signed)
Lovastatin and ASA

## 2016-03-30 NOTE — Progress Notes (Signed)
Pre visit review using our clinic review tool, if applicable. No additional management support is needed unless otherwise documented below in the visit note. 

## 2016-04-11 ENCOUNTER — Other Ambulatory Visit: Payer: Self-pay | Admitting: Internal Medicine

## 2016-04-13 NOTE — Telephone Encounter (Signed)
Called refill into [facility] had to leave on pharmacy vm.../lmb 

## 2016-05-22 ENCOUNTER — Other Ambulatory Visit: Payer: Self-pay | Admitting: Internal Medicine

## 2016-06-20 ENCOUNTER — Encounter (HOSPITAL_COMMUNITY): Payer: Self-pay | Admitting: Emergency Medicine

## 2016-06-20 ENCOUNTER — Emergency Department (HOSPITAL_COMMUNITY)
Admission: EM | Admit: 2016-06-20 | Discharge: 2016-06-20 | Disposition: A | Discharge status: Home / Self Care | Payer: Medicare HMO | Attending: Emergency Medicine | Admitting: Emergency Medicine

## 2016-06-20 DIAGNOSIS — Z853 Personal history of malignant neoplasm of breast: Secondary | ICD-10-CM | POA: Diagnosis not present

## 2016-06-20 DIAGNOSIS — Z7982 Long term (current) use of aspirin: Secondary | ICD-10-CM | POA: Diagnosis not present

## 2016-06-20 DIAGNOSIS — Z79899 Other long term (current) drug therapy: Secondary | ICD-10-CM | POA: Insufficient documentation

## 2016-06-20 DIAGNOSIS — R112 Nausea with vomiting, unspecified: Secondary | ICD-10-CM | POA: Diagnosis not present

## 2016-06-20 DIAGNOSIS — E860 Dehydration: Secondary | ICD-10-CM | POA: Insufficient documentation

## 2016-06-20 DIAGNOSIS — I10 Essential (primary) hypertension: Secondary | ICD-10-CM | POA: Diagnosis not present

## 2016-06-20 LAB — BASIC METABOLIC PANEL
Anion gap: 12 (ref 5–15)
BUN: 32 mg/dL — AB (ref 6–20)
CHLORIDE: 102 mmol/L (ref 101–111)
CO2: 25 mmol/L (ref 22–32)
CREATININE: 0.73 mg/dL (ref 0.44–1.00)
Calcium: 9.9 mg/dL (ref 8.9–10.3)
GFR calc non Af Amer: >60 mL/min (ref >60)
Glucose, Bld: 96 mg/dL (ref 65–99)
POTASSIUM: 3.7 mmol/L (ref 3.5–5.1)
Sodium: 139 mmol/L (ref 135–145)

## 2016-06-20 LAB — I-STAT CG4 LACTIC ACID, ED: LACTIC ACID, VENOUS: 2.05 mmol/L — AB (ref 0.5–1.9)

## 2016-06-20 LAB — CBC WITH DIFFERENTIAL/PLATELET
Basophils Absolute: 0 10*3/uL (ref 0.0–0.1)
Basophils Relative: 0 %
Eosinophils Absolute: 0 10*3/uL (ref 0.0–0.7)
Eosinophils Relative: 0 %
HEMATOCRIT: 42.6 % (ref 36.0–46.0)
HEMOGLOBIN: 14.6 g/dL (ref 12.0–15.0)
LYMPHS ABS: 1.4 10*3/uL (ref 0.7–4.0)
Lymphocytes Relative: 15 %
MCH: 30.2 pg (ref 26.0–34.0)
MCHC: 34.3 g/dL (ref 30.0–36.0)
MCV: 88.2 fL (ref 78.0–100.0)
Monocytes Absolute: 1 10*3/uL (ref 0.1–1.0)
Monocytes Relative: 11 %
NEUTROS ABS: 6.8 10*3/uL (ref 1.7–7.7)
NEUTROS PCT: 74 %
Platelets: 253 10*3/uL (ref 150–400)
RBC: 4.83 MIL/uL (ref 3.87–5.11)
RDW: 13.1 % (ref 11.5–15.5)
WBC: 9.3 10*3/uL (ref 4.0–10.5)

## 2016-06-20 LAB — URINALYSIS, ROUTINE W REFLEX MICROSCOPIC
Glucose, UA: NEGATIVE mg/dL
Ketones, ur: 15 mg/dL — AB
LEUKOCYTES UA: NEGATIVE
NITRITE: NEGATIVE
PH: 5.5 (ref 5.0–8.0)
SPECIFIC GRAVITY, URINE: 1.025 (ref 1.005–1.030)

## 2016-06-20 LAB — POC OCCULT BLOOD, ED: FECAL OCCULT BLD: POSITIVE — AB

## 2016-06-20 LAB — I-STAT TROPONIN, ED: TROPONIN I, POC: 0 ng/mL (ref 0.00–0.08)

## 2016-06-20 LAB — URINE MICROSCOPIC-ADD ON

## 2016-06-20 LAB — LIPASE, BLOOD: Lipase: 31 U/L (ref 11–51)

## 2016-06-20 MED ORDER — ONDANSETRON 4 MG PO TBDP: ORAL_TABLET | ORAL | 0 refills | Status: DC

## 2016-06-20 MED ORDER — SODIUM CHLORIDE 0.9 % IV BOLUS (SEPSIS)
500.0000 mL | Freq: Once | INTRAVENOUS | Status: AC
  Administered 2016-06-20: 500 mL via INTRAVENOUS

## 2016-06-20 MED ORDER — ONDANSETRON HCL 4 MG/2ML IJ SOLN
4.0000 mg | Freq: Once | INTRAMUSCULAR | Status: AC
  Administered 2016-06-20: 4 mg via INTRAVENOUS
  Filled 2016-06-20: qty 2

## 2016-06-20 NOTE — ED Triage Notes (Addendum)
Patient complaining of vomiting x 2 days. Patient has dementia but complains of weakness "for a couple of days." Patient daughter at bedside. Per daughter, patient has had dark black looking stools x 2 days also. Patient denies pain.

## 2016-06-20 NOTE — Discharge Instructions (Signed)
If you were given medicines take as directed.  If you are on coumadin or contraceptives realize their levels and effectiveness is altered by many different medicines.  If you have any reaction (rash, tongues swelling, other) to the medicines stop taking and see a physician.    If your blood pressure was elevated in the ER make sure you follow up for management with a primary doctor or return for chest pain, shortness of breath or stroke symptoms.  Please follow up as directed and return to the ER or see a physician for new or worsening symptoms.  Thank you. Vitals:   06/20/16 0930 06/20/16 1000 06/20/16 1030 06/20/16 1230  BP: (!) 140/54 146/61 127/57 133/87  Pulse: 84 66 81 87  Resp: 21 10 16 22   Temp:      TempSrc:      SpO2: 97% 99% 98% 96%  Weight:      Height:

## 2016-06-20 NOTE — ED Provider Notes (Signed)
[facility] DEPT Provider Note   CSN: [id number] Arrival date & time: 06/20/16  0815  By signing my name below, I, Higinio Plan, attest that this documentation has been prepared under the direction and in the presence of Elnora Morrison, MD . Electronically Signed: Higinio Plan, Scribe. 06/20/2016. 10:02 AM.  History   Chief Complaint Chief Complaint  Patient presents with  . Emesis   The history is provided by the patient. No language interpreter was used.   HPI Comments: Regina Williams is a 82 y.o. female with PMHx of HTN, HLD and breast cancer, who presents to the Emergency Department complaining of persistent, nausea and vomiting that began 2 days ago. Pt reports associated abdominal pain, generalized weakness and fatigue. She notes she has been "in bed more often" due to fatigue and nausea which is unusual for her. She denies headache, visual changes, neck pain, chest pain, cough, shortness of breath and dysuria.   Past Medical History:  Diagnosis Date  . Anxiety   . Breast cancer ([place]) 1994   right  . Cystocele   . Dry eyes   . Hyperlipidemia   . Hypertension   . Internal hemorrhoids   . Liver lesion 2013   multiple  . Osteoporosis     Patient Active Problem List   Diagnosis Date Noted  . Orthostatic syncope 04/09/2015  . Allergic rhinitis 10/27/2014  . Trochanteric bursitis of right hip 06/27/2014  . Dementia 10/02/2013  . Adjustment disorder with mixed anxiety and depressed mood 01/24/2013  . Hematoma of left thigh 12/27/2012  . Recurrent knee pain 12/26/2012  . Hematochezia 11/05/2012  . Shoulder pain, left 05/18/2012  . Well adult exam 03/22/2011  . Neoplasm of uncertain behavior of skin 09/21/2010  . CHILLS WITHOUT FEVER 09/15/2009  . Pain in limb 09/15/2008  . Peripheral vascular disease ([place]) 11/12/2007  . BRONCHITIS 11/12/2007  . ALLERGY, HX OF 11/12/2007  . Actinic keratosis 09/17/2007  . Dyslipidemia 02/23/2007  . Anxiety state 02/23/2007  .  Essential hypertension 02/23/2007  . Asymptomatic stenosis of left carotid artery 02/23/2007  . OSTEOPOROSIS 02/23/2007  . ABNORMAL ELECTROCARDIOGRAM 02/23/2007  . BREAST CANCER, HX OF 02/23/2007    Past Surgical History:  Procedure Laterality Date  . ABDOMINAL HYSTERECTOMY    . CHOLECYSTECTOMY    . MASTECTOMY      OB History    No data available     Home Medications    Prior to Admission medications   Medication Sig Start Date End Date Taking? Authorizing Provider  aspirin 81 MG tablet Take 81 mg by mouth daily.     Yes Historical Provider, MD  cholecalciferol (VITAMIN D) 1000 UNITS tablet Take 2,000 Units by mouth daily.     Yes Historical Provider, MD  FLUoxetine (PROZAC) 10 MG tablet GIVE ONE-HALF TABLET BY MOUTH ONCE DAILY 02/11/16  Yes Evie Lacks Plotnikov, MD  hydrocortisone (ANUSOL-HC) 25 MG suppository Place 1 suppository (25 mg total) rectally at bedtime. 01/04/13  Yes Lafayette Dragon, MD  lisinopril (PRINIVIL,ZESTRIL) 2.5 MG tablet Take 1 tablet (2.5 mg total) by mouth daily. 05/12/15  Yes Evie Lacks Plotnikov, MD  loratadine (CLARITIN) 10 MG tablet Take 1 tablet (10 mg total) by mouth daily. Patient taking differently: Take 10 mg by mouth daily as needed for allergies, rhinitis or itching.  10/27/14  Yes Evie Lacks Plotnikov, MD  LORazepam (ATIVAN) 1 MG tablet TAKE ONE TABLET BY MOUTH TWICE DAILY AS NEEDED 04/12/16  Yes Cassandria Anger, MD  lovastatin (  MEVACOR) 20 MG tablet TAKE ONE TABLET BY MOUTH EVERY DAY AT BEDTIME 03/30/16  Yes Evie Lacks Plotnikov, MD  memantine (NAMENDA XR) 14 MG CP24 24 hr capsule Take 1 capsule (14 mg total) by mouth daily. 05/12/15  Yes Cassandria Anger, MD  Polyethyl Glycol-Propyl Glycol (SYSTANE PRESERVATIVE FREE) 0.4-0.3 % SOLN Place 1 drop into both eyes daily as needed (dry eyes).    Yes Historical Provider, MD  ondansetron (ZOFRAN ODT) 4 MG disintegrating tablet 4mg  ODT q4 hours prn nausea/vomit 06/20/16   Elnora Morrison, MD    Family  History Family History  Problem Relation Age of Onset  . Heart disease Mother 54    MI  . Colon cancer Neg Hx     Social History Social History  Substance Use Topics  . Smoking status: Never Smoker  . Smokeless tobacco: Never Used  . Alcohol use No    Allergies   Patient has no known allergies.   Review of Systems Review of Systems  Eyes: Negative for visual disturbance.  Respiratory: Negative for cough and shortness of breath.   Cardiovascular: Negative for chest pain.  Gastrointestinal: Positive for abdominal pain, nausea and vomiting.  Musculoskeletal: Negative for back pain and neck pain.  Neurological: Positive for weakness. Negative for headaches.   Physical Exam Updated Vital Signs BP 159/78 (BP Location: Left Arm)   Pulse 94   Resp 20   Ht 5' (1.524 m)   Wt 94 lb (42.6 kg)   SpO2 98%   BMI 18.36 kg/m   Physical Exam  Constitutional: She is oriented to person, place, and time. She appears well-developed and well-nourished.  HENT:  Head: Normocephalic.  Slightly dry mucous membranes.   Eyes: EOM are normal. Pupils are equal, round, and reactive to light.  Neck: Normal range of motion.  Cardiovascular:  Systolic murmur to anterior upper border.   Pulmonary/Chest: Effort normal and breath sounds normal.  Lungs clear to auscultation bilaterally.  Abdominal: Soft. She exhibits no distension. There is no tenderness.  Musculoskeletal: Normal range of motion. She exhibits no edema.  Pt can sit up on her own.   Neurological: She is alert and oriented to person, place, and time.  No facial droop.   Skin: No rash noted.  Psychiatric: She has a normal mood and affect.  Nursing note and vitals reviewed.  ED Treatments / Results  Labs (all labs ordered are listed, but only abnormal results are displayed) Labs Reviewed  URINALYSIS, ROUTINE W REFLEX MICROSCOPIC (NOT AT [facility]) - Abnormal; Notable for the following:       Result Value   APPearance HAZY (*)     Hgb urine dipstick SMALL (*)    Bilirubin Urine SMALL (*)    Ketones, ur 15 (*)    Protein, ur TRACE (*)    All other components within normal limits  BASIC METABOLIC PANEL - Abnormal; Notable for the following:    BUN 32 (*)    All other components within normal limits  URINE MICROSCOPIC-ADD ON - Abnormal; Notable for the following:    Squamous Epithelial / LPF 6-30 (*)    Bacteria, UA FEW (*)    All other components within normal limits  I-STAT CG4 LACTIC ACID, ED - Abnormal; Notable for the following:    Lactic Acid, Venous 2.05 (*)    All other components within normal limits  POC OCCULT BLOOD, ED - Abnormal; Notable for the following:    Fecal Occult Bld POSITIVE (*)  All other components within normal limits  CBC WITH DIFFERENTIAL/PLATELET  LIPASE, BLOOD  I-STAT TROPOININ, ED   EKG  EKG Interpretation  Date/Time:  Monday June 20 2016 08:48:30 EST Ventricular Rate:  81 PR Interval:    QRS Duration: 126 QT Interval:  394 QTC Calculation: 458 R Axis:   -49 Text Interpretation:  Sinus rhythm RBBB and LAFB Confirmed by Berneita Sanagustin MD, Vonna Kotyk [id number]) on 06/20/2016 12:11:35 PM       Radiology No results found.  Procedures Procedures (including critical care time)  Medications Ordered in ED Medications  sodium chloride 0.9 % bolus 500 mL (0 mLs Intravenous Stopped 06/20/16 1002)  ondansetron (ZOFRAN) injection 4 mg (4 mg Intravenous Given 06/20/16 0912)   DIAGNOSTIC STUDIES:  Oxygen Saturation is 98% on RA, normal by my interpretation.    COORDINATION OF CARE:  9:03 AM Discussed treatment plan with pt at bedside and pt agreed to plan.  Initial Impression / Assessment and Plan / ED Course  I have reviewed the triage vital signs and the nursing notes.  Pertinent labs & imaging results that were available during my care of the patient were reviewed by me and considered in my medical decision making (see chart for details).  Clinical Course      Final  Clinical Impressions(s) / ED Diagnoses   Final diagnoses:  Non-intractable vomiting with nausea, unspecified vomiting type  Dehydration    New Prescriptions Discharge Medication List as of 06/20/2016  1:51 PM    START taking these medications   Details  ondansetron (ZOFRAN ODT) 4 MG disintegrating tablet 4mg  ODT q4 hours prn nausea/vomit, Print         Elnora Morrison, MD 06/21/16 2206

## 2016-06-22 ENCOUNTER — Other Ambulatory Visit (INDEPENDENT_AMBULATORY_CARE_PROVIDER_SITE_OTHER): Payer: Medicare HMO

## 2016-06-22 ENCOUNTER — Ambulatory Visit (INDEPENDENT_AMBULATORY_CARE_PROVIDER_SITE_OTHER): Payer: Medicare HMO | Admitting: Internal Medicine

## 2016-06-22 ENCOUNTER — Ambulatory Visit (INDEPENDENT_AMBULATORY_CARE_PROVIDER_SITE_OTHER)
Admission: RE | Admit: 2016-06-22 | Discharge: 2016-06-22 | Disposition: A | Discharge status: Home / Self Care | Payer: Medicare HMO | Source: Ambulatory Visit | Attending: Internal Medicine | Admitting: Internal Medicine

## 2016-06-22 ENCOUNTER — Encounter: Payer: Self-pay | Admitting: Internal Medicine

## 2016-06-22 VITALS — BP 110/62 | HR 79 | Temp 97.9°F | Wt 93.0 lb

## 2016-06-22 DIAGNOSIS — F05 Delirium due to known physiological condition: Secondary | ICD-10-CM

## 2016-06-22 DIAGNOSIS — K921 Melena: Secondary | ICD-10-CM

## 2016-06-22 DIAGNOSIS — I10 Essential (primary) hypertension: Secondary | ICD-10-CM

## 2016-06-22 DIAGNOSIS — F01518 Vascular dementia, unspecified severity, with other behavioral disturbance: Secondary | ICD-10-CM

## 2016-06-22 DIAGNOSIS — R69 Illness, unspecified: Secondary | ICD-10-CM | POA: Diagnosis not present

## 2016-06-22 DIAGNOSIS — R202 Paresthesia of skin: Secondary | ICD-10-CM

## 2016-06-22 DIAGNOSIS — R410 Disorientation, unspecified: Secondary | ICD-10-CM | POA: Diagnosis not present

## 2016-06-22 DIAGNOSIS — F0151 Vascular dementia with behavioral disturbance: Secondary | ICD-10-CM

## 2016-06-22 LAB — CBC WITH DIFFERENTIAL/PLATELET
BASOS PCT: 0.7 % (ref 0.0–3.0)
Basophils Absolute: 0 10*3/uL (ref 0.0–0.1)
EOS PCT: 1.9 % (ref 0.0–5.0)
Eosinophils Absolute: 0.1 10*3/uL (ref 0.0–0.7)
HEMATOCRIT: 38.6 % (ref 36.0–46.0)
HEMOGLOBIN: 13.3 g/dL (ref 12.0–15.0)
LYMPHS PCT: 15.6 % (ref 12.0–46.0)
Lymphs Abs: 1.1 10*3/uL (ref 0.7–4.0)
MCHC: 34.3 g/dL (ref 30.0–36.0)
MCV: 87 fl (ref 78.0–100.0)
MONO ABS: 0.8 10*3/uL (ref 0.1–1.0)
Monocytes Relative: 11.7 % (ref 3.0–12.0)
Neutro Abs: 4.8 10*3/uL (ref 1.4–7.7)
Neutrophils Relative %: 70.1 % (ref 43.0–77.0)
Platelets: 224 10*3/uL (ref 150.0–400.0)
RBC: 4.44 Mil/uL (ref 3.87–5.11)
RDW: 13.4 % (ref 11.5–15.5)
WBC: 6.9 10*3/uL (ref 4.0–10.5)

## 2016-06-22 LAB — BASIC METABOLIC PANEL
BUN: 26 mg/dL — AB (ref 6–23)
CALCIUM: 9.5 mg/dL (ref 8.4–10.5)
CO2: 31 mEq/L (ref 19–32)
CREATININE: 0.72 mg/dL (ref 0.40–1.20)
Chloride: 104 mEq/L (ref 96–112)
GFR: 81.05 mL/min (ref >60.00)
GLUCOSE: 108 mg/dL — AB (ref 70–99)
Potassium: 3.8 mEq/L (ref 3.5–5.1)
Sodium: 142 mEq/L (ref 135–145)

## 2016-06-22 LAB — HEPATIC FUNCTION PANEL
ALBUMIN: 3.9 g/dL (ref 3.5–5.2)
ALT: 26 U/L (ref 0–35)
AST: 24 U/L (ref 0–37)
Alkaline Phosphatase: 49 U/L (ref 39–117)
Bilirubin, Direct: 0.2 mg/dL (ref 0.0–0.3)
Total Bilirubin: 0.8 mg/dL (ref 0.2–1.2)
Total Protein: 6.2 g/dL (ref 6.0–8.3)

## 2016-06-22 LAB — TSH: TSH: 1.18 u[IU]/mL (ref 0.35–4.50)

## 2016-06-22 LAB — VITAMIN B12: Vitamin B-12: 918 pg/mL — ABNORMAL HIGH (ref 211–911)

## 2016-06-22 MED ORDER — MEMANTINE HCL-DONEPEZIL HCL ER 28-10 MG PO CP24: 1.0000 | ORAL_CAPSULE | Freq: Every day | ORAL | 11 refills | Status: DC

## 2016-06-22 NOTE — Assessment & Plan Note (Signed)
Lisinopril 

## 2016-06-22 NOTE — Progress Notes (Signed)
Pre visit review using our clinic review tool, if applicable. No additional management support is needed unless otherwise documented below in the visit note. 

## 2016-06-22 NOTE — Assessment & Plan Note (Signed)
11/17 G(+) stool in ER CBC May need a GI ref

## 2016-06-22 NOTE — Progress Notes (Signed)
Subjective:  Patient ID: Regina Williams, female    DOB: June 11, 1925  Age: 82 y.o. MRN: [id number]  CC: No chief complaint on file.   HPI NOE RUBALCAVA presents for a GI illness on Mon this wk - went to ER, had Zofran and IVF. The pt got confused, seeing things, agitated, wondering, rambling, hostile when they got home - it lasted for 2 days - better today...  Outpatient Medications Prior to Visit  Medication Sig Dispense Refill  . aspirin 81 MG tablet Take 81 mg by mouth daily.      . cholecalciferol (VITAMIN D) 1000 UNITS tablet Take 2,000 Units by mouth daily.      Marland Kitchen FLUoxetine (PROZAC) 10 MG tablet GIVE ONE-HALF TABLET BY MOUTH ONCE DAILY 90 tablet 0  . hydrocortisone (ANUSOL-HC) 25 MG suppository Place 1 suppository (25 mg total) rectally at bedtime. 12 suppository 0  . lisinopril (PRINIVIL,ZESTRIL) 2.5 MG tablet Take 1 tablet (2.5 mg total) by mouth daily. 90 tablet 3  . loratadine (CLARITIN) 10 MG tablet Take 1 tablet (10 mg total) by mouth daily. (Patient taking differently: Take 10 mg by mouth daily as needed for allergies, rhinitis or itching. ) 300 tablet 1  . LORazepam (ATIVAN) 1 MG tablet TAKE ONE TABLET BY MOUTH TWICE DAILY AS NEEDED 60 tablet 3  . lovastatin (MEVACOR) 20 MG tablet TAKE ONE TABLET BY MOUTH EVERY DAY AT BEDTIME 90 tablet 3  . memantine (NAMENDA XR) 14 MG CP24 24 hr capsule Take 1 capsule (14 mg total) by mouth daily. 90 capsule 3  . ondansetron (ZOFRAN ODT) 4 MG disintegrating tablet 4mg  ODT q4 hours prn nausea/vomit 8 tablet 0  . Polyethyl Glycol-Propyl Glycol (SYSTANE PRESERVATIVE FREE) 0.4-0.3 % SOLN Place 1 drop into both eyes daily as needed (dry eyes).      Facility-Administered Medications Prior to Visit  Medication Dose Route Frequency Provider Last Rate Last Dose  . methylPREDNISolone acetate (DEPO-MEDROL) injection 40 mg  40 mg Intra-articular Once Cassandria Anger, MD        ROS Review of Systems  Constitutional: Negative for activity change,  appetite change, chills, fatigue and unexpected weight change.  HENT: Negative for congestion, mouth sores and sinus pressure.   Eyes: Negative for visual disturbance.  Respiratory: Negative for cough and chest tightness.   Gastrointestinal: Negative for abdominal pain and nausea.  Genitourinary: Negative for difficulty urinating, frequency and vaginal pain.  Musculoskeletal: Negative for back pain and gait problem.  Skin: Negative for pallor and rash.  Neurological: Negative for dizziness, tremors, weakness, numbness and headaches.  Psychiatric/Behavioral: Positive for agitation, behavioral problems, confusion and decreased concentration. Negative for sleep disturbance and suicidal ideas.    Objective:  BP 110/62   Pulse 79   Temp 97.9 F (36.6 C) (Oral)   Wt 93 lb (42.2 kg)   SpO2 98%   BMI 18.16 kg/m   BP Readings from Last 3 Encounters:  06/22/16 110/62  06/20/16 146/69  03/30/16 110/60    Wt Readings from Last 3 Encounters:  06/22/16 93 lb (42.2 kg)  06/20/16 94 lb (42.6 kg)  03/30/16 94 lb (42.6 kg)    Physical Exam  Constitutional: She appears well-developed. No distress.  HENT:  Head: Normocephalic.  Right Ear: External ear normal.  Left Ear: External ear normal.  Nose: Nose normal.  Mouth/Throat: Oropharynx is clear and moist.  Eyes: Conjunctivae are normal. Pupils are equal, round, and reactive to light. Right eye exhibits no discharge. Left eye  exhibits no discharge.  Neck: Normal range of motion. Neck supple. No JVD present. No tracheal deviation present. No thyromegaly present.  Cardiovascular: Normal rate, regular rhythm and normal heart sounds.   Pulmonary/Chest: No stridor. No respiratory distress. She has no wheezes.  Abdominal: Soft. Bowel sounds are normal. She exhibits no distension and no mass. There is no tenderness. There is no rebound and no guarding.  Musculoskeletal: She exhibits no edema or tenderness.  Lymphadenopathy:    She has no  cervical adenopathy.  Neurological: She displays normal reflexes. No cranial nerve deficit. She exhibits normal muscle tone. Coordination normal.  Skin: No rash noted. No erythema.  Psychiatric: She has a normal mood and affect. Her behavior is normal.    Lab Results  Component Value Date   WBC 9.3 06/20/2016   HGB 14.6 06/20/2016   HCT 42.6 06/20/2016   PLT 253 06/20/2016   GLUCOSE 96 06/20/2016   CHOL 189 07/09/2013   TRIG 78.0 07/09/2013   HDL 81.80 07/09/2013   LDLCALC 92 07/09/2013   ALT 21 11/27/2015   AST 21 11/27/2015   NA 139 06/20/2016   K 3.7 06/20/2016   CL 102 06/20/2016   CREATININE 0.73 06/20/2016   BUN 32 (H) 06/20/2016   CO2 25 06/20/2016   TSH 2.20 11/27/2015    No results found.  Assessment & Plan:   There are no diagnoses linked to this encounter. I am having Ms. Lundell maintain her aspirin, Polyethyl Glycol-Propyl Glycol, cholecalciferol, hydrocortisone, loratadine, lisinopril, memantine, FLUoxetine, lovastatin, LORazepam, and ondansetron. We will continue to administer methylPREDNISolone acetate.  No orders of the defined types were placed in this encounter.    Follow-up: No Follow-up on file.  Walker Kehr, MD

## 2016-06-22 NOTE — Assessment & Plan Note (Signed)
worse - will try Namzaric again - starter pack Labs Head CT

## 2016-06-22 NOTE — Assessment & Plan Note (Signed)
D/c Zofran Lorazepam helped We will try Namzaric again - starter pack Labs Head CT

## 2016-07-27 ENCOUNTER — Ambulatory Visit (INDEPENDENT_AMBULATORY_CARE_PROVIDER_SITE_OTHER): Payer: Medicare HMO | Admitting: Internal Medicine

## 2016-07-27 ENCOUNTER — Encounter: Payer: Self-pay | Admitting: Internal Medicine

## 2016-07-27 DIAGNOSIS — R69 Illness, unspecified: Secondary | ICD-10-CM | POA: Diagnosis not present

## 2016-07-27 DIAGNOSIS — F411 Generalized anxiety disorder: Secondary | ICD-10-CM

## 2016-07-27 DIAGNOSIS — Z0001 Encounter for general adult medical examination with abnormal findings: Secondary | ICD-10-CM | POA: Diagnosis not present

## 2016-07-27 DIAGNOSIS — L570 Actinic keratosis: Secondary | ICD-10-CM | POA: Diagnosis not present

## 2016-07-27 DIAGNOSIS — F0151 Vascular dementia with behavioral disturbance: Secondary | ICD-10-CM

## 2016-07-27 DIAGNOSIS — F0390 Unspecified dementia without behavioral disturbance: Secondary | ICD-10-CM

## 2016-07-27 DIAGNOSIS — F01518 Vascular dementia, unspecified severity, with other behavioral disturbance: Secondary | ICD-10-CM

## 2016-07-27 DIAGNOSIS — Z0000 Encounter for general adult medical examination without abnormal findings: Secondary | ICD-10-CM

## 2016-07-27 DIAGNOSIS — I10 Essential (primary) hypertension: Secondary | ICD-10-CM

## 2016-07-27 NOTE — Assessment & Plan Note (Signed)
Here for medicare wellness/physical  Diet: heart healthy  Physical activity: not sedentary  Depression/mood screen: negative on meds Hearing: decreased to whispered voice  Visual acuity: grossly normal, performs annual eye exam - glasses ADLs: capable w/help Fall risk: low to moderate Home safety: good  Cognitive evaluation: forgetful EOL planning: adv directives, full code/ I agree  I have personally reviewed and have noted  1. The patient's medical, surgical and social history  2. Their use of alcohol, tobacco or illicit drugs  3. Their current medications and supplements  4. The patient's functional ability including ADL's, fall risks, home safety risks and hearing or visual impairment.  5. Diet and physical activities  6. Evidence for depression or mood disorders 7. The roster of all physicians providing medical care to patient - is listed in the Snapshot section of the chart and reviewed today.    Today patient counseled on age appropriate routine health concerns for screening and prevention, each reviewed and up to date or declined. Immunizations reviewed and up to date or declined. Labs ordered and reviewed. Risk factors for depression reviewed and negative. Hearing function and visual acuity are intact. ADLs screened and addressed as needed. Functional ability and level of safety reviewed and appropriate. Education, counseling and referrals performed based on assessed risks today. Patient provided with a copy of personalized plan for preventive services.

## 2016-07-27 NOTE — Assessment & Plan Note (Signed)
Lisinopril 

## 2016-07-27 NOTE — Assessment & Plan Note (Signed)
On Lorazepam prn Prozac qd 

## 2016-07-27 NOTE — Progress Notes (Signed)
Subjective:  Patient ID: Regina Williams, female    DOB: [birth year]  Age: 83 y.o. MRN: [id number]  CC: No chief complaint on file.   HPI PAISLEIGH KUBENA presents for a well exam Doing better, calmer  Outpatient Medications Prior to Visit  Medication Sig Dispense Refill  . aspirin 81 MG tablet Take 81 mg by mouth daily.      . cholecalciferol (VITAMIN D) 1000 UNITS tablet Take 2,000 Units by mouth daily.      Marland Kitchen FLUoxetine (PROZAC) 10 MG tablet GIVE ONE-HALF TABLET BY MOUTH ONCE DAILY 90 tablet 0  . hydrocortisone (ANUSOL-HC) 25 MG suppository Place 1 suppository (25 mg total) rectally at bedtime. 12 suppository 0  . lisinopril (PRINIVIL,ZESTRIL) 2.5 MG tablet Take 1 tablet (2.5 mg total) by mouth daily. 90 tablet 3  . loratadine (CLARITIN) 10 MG tablet Take 1 tablet (10 mg total) by mouth daily. (Patient taking differently: Take 10 mg by mouth daily as needed for allergies, rhinitis or itching. ) 300 tablet 1  . LORazepam (ATIVAN) 1 MG tablet TAKE ONE TABLET BY MOUTH TWICE DAILY AS NEEDED 60 tablet 3  . lovastatin (MEVACOR) 20 MG tablet TAKE ONE TABLET BY MOUTH EVERY DAY AT BEDTIME 90 tablet 3  . Memantine HCl-Donepezil HCl (NAMZARIC) 28-10 MG CP24 Take 1 tablet by mouth daily. 30 capsule 11  . Polyethyl Glycol-Propyl Glycol (SYSTANE PRESERVATIVE FREE) 0.4-0.3 % SOLN Place 1 drop into both eyes daily as needed (dry eyes).      Facility-Administered Medications Prior to Visit  Medication Dose Route Frequency Provider Last Rate Last Dose  . methylPREDNISolone acetate (DEPO-MEDROL) injection 40 mg  40 mg Intra-articular Once Cassandria Anger, MD        ROS Review of Systems  Constitutional: Negative for activity change, appetite change, chills, fatigue and unexpected weight change.  HENT: Negative for congestion, mouth sores and sinus pressure.   Eyes: Negative for visual disturbance.  Respiratory: Negative for cough and chest tightness.   Gastrointestinal: Negative for abdominal pain  and nausea.  Genitourinary: Negative for difficulty urinating, frequency and vaginal pain.  Musculoskeletal: Negative for back pain and gait problem.  Skin: Negative for pallor and rash.  Neurological: Negative for dizziness, tremors, weakness, numbness and headaches.  Psychiatric/Behavioral: Positive for decreased concentration. Negative for confusion, sleep disturbance and suicidal ideas. The patient is nervous/anxious.   red spot on nose  Objective:  BP 130/70   Pulse 70   Ht 5' (1.524 m)   Wt 92 lb (41.7 kg)   SpO2 98%   BMI 17.97 kg/m   BP Readings from Last 3 Encounters:  07/27/16 130/70  06/22/16 110/62  06/20/16 146/69    Wt Readings from Last 3 Encounters:  07/27/16 92 lb (41.7 kg)  06/22/16 93 lb (42.2 kg)  06/20/16 94 lb (42.6 kg)    Physical Exam  Constitutional: She appears well-developed. No distress.  HENT:  Head: Normocephalic.  Right Ear: External ear normal.  Left Ear: External ear normal.  Nose: Nose normal.  Mouth/Throat: Oropharynx is clear and moist.  Eyes: Conjunctivae are normal. Pupils are equal, round, and reactive to light. Right eye exhibits no discharge. Left eye exhibits no discharge.  Neck: Normal range of motion. Neck supple. No JVD present. No tracheal deviation present. No thyromegaly present.  Cardiovascular: Normal rate, regular rhythm and normal heart sounds.   Pulmonary/Chest: No stridor. No respiratory distress. She has no wheezes.  Abdominal: Soft. Bowel sounds are normal. She exhibits no  distension and no mass. There is no tenderness. There is no rebound and no guarding.  Musculoskeletal: She exhibits no edema or tenderness.  Lymphadenopathy:    She has no cervical adenopathy.  Neurological: She displays normal reflexes. No cranial nerve deficit. She exhibits normal muscle tone. Coordination normal.  Skin: No rash noted. No erythema.  Psychiatric: She has a normal mood and affect. Her behavior is normal. Thought content normal.    AK on nose    Procedure Note :     Procedure : Cryosurgery   Indication:    Actinic keratosis(es)   Risks including unsuccessful procedure , bleeding, infection, bruising, scar, a need for a repeat  procedure and others were explained to the patient in detail as well as the benefits. Informed consent was obtained verbally.    1 lesion(s)  on  The nose  was/were treated with liquid nitrogen on a Q-tip in a usual fasion . Band-Aid was applied and antibiotic ointment was given for a later use.   Tolerated well. Complications none.   Postprocedure instructions :     Keep the wounds clean. You can wash them with liquid soap and water. Pat dry with gauze or a Kleenex tissue  Before applying antibiotic ointment and a Band-Aid.   You need to report immediately  if  any signs of infection develop.     Lab Results  Component Value Date   WBC 6.9 06/22/2016   HGB 13.3 06/22/2016   HCT 38.6 06/22/2016   PLT 224.0 06/22/2016   GLUCOSE 108 (H) 06/22/2016   CHOL 189 07/09/2013   TRIG 78.0 07/09/2013   HDL 81.80 07/09/2013   LDLCALC 92 07/09/2013   ALT 26 06/22/2016   AST 24 06/22/2016   NA 142 06/22/2016   K 3.8 06/22/2016   CL 104 06/22/2016   CREATININE 0.72 06/22/2016   BUN 26 (H) 06/22/2016   CO2 31 06/22/2016   TSH 1.18 06/22/2016    Ct Head Wo Contrast  Result Date: 06/22/2016 CLINICAL DATA:  83 year old female with acute confusional state. Vascular dementia. Initial encounter. EXAM: CT HEAD WITHOUT CONTRAST TECHNIQUE: Contiguous axial images were obtained from the base of the skull through the vertex without intravenous contrast. COMPARISON:  Head CT without contrast 04/09/2015. FINDINGS: Brain: Generalized cerebral volume loss appears not significantly changed. Gray-white matter differentiation appears normal for age throughout the brain. No cortical encephalomalacia identified. No midline shift, ventriculomegaly, mass effect, evidence of mass lesion, intracranial  hemorrhage or evidence of cortically based acute infarction. Vascular: Calcified atherosclerosis at the skull base. No suspicious intracranial vascular hyperdensity. Skull: No acute osseous abnormality identified. Sinuses/Orbits: Clear. Other: No acute visible orbit or scalp soft tissue findings. IMPRESSION: No acute intracranial abnormality. Stable and negative noncontrast CT appearance of the brain aside from chronic generalized cerebral volume loss. Electronically Signed   By: Genevie Ann M.D.   On: 06/22/2016 14:10    Assessment & Plan:   There are no diagnoses linked to this encounter. I am having Ms. Demerchant maintain her aspirin, Polyethyl Glycol-Propyl Glycol, cholecalciferol, hydrocortisone, loratadine, lisinopril, FLUoxetine, lovastatin, LORazepam, and Memantine HCl-Donepezil HCl. We will continue to administer methylPREDNISolone acetate.  No orders of the defined types were placed in this encounter.    Follow-up: No Follow-up on file.  Walker Kehr, MD

## 2016-07-27 NOTE — Progress Notes (Signed)
Pre visit review using our clinic review tool, if applicable. No additional management support is needed unless otherwise documented below in the visit note. 

## 2016-07-27 NOTE — Assessment & Plan Note (Signed)
Doing well 

## 2016-07-27 NOTE — Patient Instructions (Addendum)
Postprocedure instructions :     Keep the wounds clean. You can wash them with liquid soap and water. Pat dry with gauze or a Kleenex tissue  Before applying antibiotic ointment and a Band-Aid.   You need to report immediately  if  any signs of infection develop.   Health Maintenance for Postmenopausal Women Introduction Menopause is a normal process in which your reproductive ability comes to an end. This process happens gradually over a span of months to years, usually between the ages of 63 and 43. Menopause is complete when you have missed 12 consecutive menstrual periods. It is important to talk with your health care provider about some of the most common conditions that affect postmenopausal women, such as heart disease, cancer, and bone loss (osteoporosis). Adopting a healthy lifestyle and getting preventive care can help to promote your health and wellness. Those actions can also lower your chances of developing some of these common conditions. What should I know about menopause? During menopause, you may experience a number of symptoms, such as:  Moderate-to-severe hot flashes.  Night sweats.  Decrease in sex drive.  Mood swings.  Headaches.  Tiredness.  Irritability.  Memory problems.  Insomnia. Choosing to treat or not to treat menopausal changes is an individual decision that you make with your health care provider. What should I know about hormone replacement therapy and supplements? Hormone therapy products are effective for treating symptoms that are associated with menopause, such as hot flashes and night sweats. Hormone replacement carries certain risks, especially as you become older. If you are thinking about using estrogen or estrogen with progestin treatments, discuss the benefits and risks with your health care provider. What should I know about heart disease and stroke? Heart disease, heart attack, and stroke become more likely as you age. This may be due, in  part, to the hormonal changes that your body experiences during menopause. These can affect how your body processes dietary fats, triglycerides, and cholesterol. Heart attack and stroke are both medical emergencies. There are many things that you can do to help prevent heart disease and stroke:  Have your blood pressure checked at least every 1-2 years. High blood pressure causes heart disease and increases the risk of stroke.  If you are 74-62 years old, ask your health care provider if you should take aspirin to prevent a heart attack or a stroke.  Do not use any tobacco products, including cigarettes, chewing tobacco, or electronic cigarettes. If you need help quitting, ask your health care provider.  It is important to eat a healthy diet and maintain a healthy weight.  Be sure to include plenty of vegetables, fruits, low-fat dairy products, and lean protein.  Avoid eating foods that are high in solid fats, added sugars, or salt (sodium).  Get regular exercise. This is one of the most important things that you can do for your health.  Try to exercise for at least 150 minutes each week. The type of exercise that you do should increase your heart rate and make you sweat. This is known as moderate-intensity exercise.  Try to do strengthening exercises at least twice each week. Do these in addition to the moderate-intensity exercise.  Know your numbers.Ask your health care provider to check your cholesterol and your blood glucose. Continue to have your blood tested as directed by your health care provider. What should I know about cancer screening? There are several types of cancer. Take the following steps to reduce your risk and  to catch any cancer development as early as possible. Breast Cancer  Practice breast self-awareness.  This means understanding how your breasts normally appear and feel.  It also means doing regular breast self-exams. Let your health care provider know about  any changes, no matter how small.  If you are 49 or older, have a clinician do a breast exam (clinical breast exam or CBE) every year. Depending on your age, family history, and medical history, it may be recommended that you also have a yearly breast X-ray (mammogram).  If you have a family history of breast cancer, talk with your health care provider about genetic screening.  If you are at high risk for breast cancer, talk with your health care provider about having an MRI and a mammogram every year.  Breast cancer (BRCA) gene test is recommended for women who have family members with BRCA-related cancers. Results of the assessment will determine the need for genetic counseling and BRCA1 and for BRCA2 testing. BRCA-related cancers include these types:  Breast. This occurs in males or females.  Ovarian.  Tubal. This may also be called fallopian tube cancer.  Cancer of the abdominal or pelvic lining (peritoneal cancer).  Prostate.  Pancreatic. Cervical, Uterine, and Ovarian Cancer  Your health care provider may recommend that you be screened regularly for cancer of the pelvic organs. These include your ovaries, uterus, and vagina. This screening involves a pelvic exam, which includes checking for microscopic changes to the surface of your cervix (Pap test).  For women ages 21-65, health care providers may recommend a pelvic exam and a Pap test every three years. For women ages 24-65, they may recommend the Pap test and pelvic exam, combined with testing for human papilloma virus (HPV), every five years. Some types of HPV increase your risk of cervical cancer. Testing for HPV may also be done on women of any age who have unclear Pap test results.  Other health care providers may not recommend any screening for nonpregnant women who are considered low risk for pelvic cancer and have no symptoms. Ask your health care provider if a screening pelvic exam is right for you.  If you have had past  treatment for cervical cancer or a condition that could lead to cancer, you need Pap tests and screening for cancer for at least 20 years after your treatment. If Pap tests have been discontinued for you, your risk factors (such as having a new sexual partner) need to be reassessed to determine if you should start having screenings again. Some women have medical problems that increase the chance of getting cervical cancer. In these cases, your health care provider may recommend that you have screening and Pap tests more often.  If you have a family history of uterine cancer or ovarian cancer, talk with your health care provider about genetic screening.  If you have vaginal bleeding after reaching menopause, tell your health care provider.  There are currently no reliable tests available to screen for ovarian cancer. Lung Cancer  Lung cancer screening is recommended for adults 65-16 years old who are at high risk for lung cancer because of a history of smoking. A yearly low-dose CT scan of the lungs is recommended if you:  Currently smoke.  Have a history of at least 30 pack-years of smoking and you currently smoke or have quit within the past 15 years. A pack-year is smoking an average of one pack of cigarettes per day for one year. Yearly screening should:  Continue until it has been 15 years since you quit.  Stop if you develop a health problem that would prevent you from having lung cancer treatment. Colorectal Cancer  This type of cancer can be detected and can often be prevented.  Routine colorectal cancer screening usually begins at age 11 and continues through age 60.  If you have risk factors for colon cancer, your health care provider may recommend that you be screened at an earlier age.  If you have a family history of colorectal cancer, talk with your health care provider about genetic screening.  Your health care provider may also recommend using home test kits to check for  hidden blood in your stool.  A small camera at the end of a tube can be used to examine your colon directly (sigmoidoscopy or colonoscopy). This is done to check for the earliest forms of colorectal cancer.  Direct examination of the colon should be repeated every 5-10 years until age 37. However, if early forms of precancerous polyps or small growths are found or if you have a family history or genetic risk for colorectal cancer, you may need to be screened more often. Skin Cancer  Check your skin from head to toe regularly.  Monitor any moles. Be sure to tell your health care provider:  About any new moles or changes in moles, especially if there is a change in a mole's shape or color.  If you have a mole that is larger than the size of a pencil eraser.  If any of your family members has a history of skin cancer, especially at a young age, talk with your health care provider about genetic screening.  Always use sunscreen. Apply sunscreen liberally and repeatedly throughout the day.  Whenever you are outside, protect yourself by wearing long sleeves, pants, a wide-brimmed hat, and sunglasses. What should I know about osteoporosis? Osteoporosis is a condition in which bone destruction happens more quickly than new bone creation. After menopause, you may be at an increased risk for osteoporosis. To help prevent osteoporosis or the bone fractures that can happen because of osteoporosis, the following is recommended:  If you are 38-67 years old, get at least 1,000 mg of calcium and at least 600 mg of vitamin D per day.  If you are older than age 24 but younger than age 22, get at least 1,200 mg of calcium and at least 600 mg of vitamin D per day.  If you are older than age 52, get at least 1,200 mg of calcium and at least 800 mg of vitamin D per day. Smoking and excessive alcohol intake increase the risk of osteoporosis. Eat foods that are rich in calcium and vitamin D, and do weight-bearing  exercises several times each week as directed by your health care provider. What should I know about how menopause affects my mental health? Depression may occur at any age, but it is more common as you become older. Common symptoms of depression include:  Low or sad mood.  Changes in sleep patterns.  Changes in appetite or eating patterns.  Feeling an overall lack of motivation or enjoyment of activities that you previously enjoyed.  Frequent crying spells. Talk with your health care provider if you think that you are experiencing depression. What should I know about immunizations? It is important that you get and maintain your immunizations. These include:  Tetanus, diphtheria, and pertussis (Tdap) booster vaccine.  Influenza every year before the flu season begins.  Pneumonia  vaccine.  Shingles vaccine. Your health care provider may also recommend other immunizations. This information is not intended to replace advice given to you by your health care provider. Make sure you discuss any questions you have with your health care provider. Document Released: 09/02/2005 Document Revised: 01/29/2016 Document Reviewed: 04/14/2015  2017 [employer]

## 2016-08-12 ENCOUNTER — Other Ambulatory Visit: Payer: Self-pay | Admitting: Internal Medicine

## 2016-08-15 NOTE — Telephone Encounter (Signed)
Rf phoned in.  

## 2016-09-15 ENCOUNTER — Other Ambulatory Visit: Payer: Self-pay | Admitting: Internal Medicine

## 2016-09-16 NOTE — Telephone Encounter (Signed)
erx sent as requested.  

## 2016-12-20 ENCOUNTER — Other Ambulatory Visit: Payer: Self-pay | Admitting: Internal Medicine

## 2016-12-22 ENCOUNTER — Other Ambulatory Visit: Payer: Self-pay | Admitting: Internal Medicine

## 2016-12-26 ENCOUNTER — Other Ambulatory Visit: Payer: Self-pay | Admitting: Internal Medicine

## 2016-12-26 NOTE — Telephone Encounter (Signed)
Called refill into [facility] left on pharmacy vm.../lmb 

## 2016-12-26 NOTE — Telephone Encounter (Signed)
Refill was called into pharmacy this morning had to leave on pharmacy vm...Johny Chess

## 2016-12-26 NOTE — Telephone Encounter (Signed)
Please resend they did not receive LORazepam (ATIVAN) 1 MG tablet

## 2017-01-31 ENCOUNTER — Encounter: Payer: Self-pay | Admitting: Internal Medicine

## 2017-01-31 ENCOUNTER — Other Ambulatory Visit (INDEPENDENT_AMBULATORY_CARE_PROVIDER_SITE_OTHER): Payer: Medicare HMO

## 2017-01-31 ENCOUNTER — Ambulatory Visit (INDEPENDENT_AMBULATORY_CARE_PROVIDER_SITE_OTHER): Payer: Medicare HMO | Admitting: Internal Medicine

## 2017-01-31 DIAGNOSIS — F01518 Vascular dementia, unspecified severity, with other behavioral disturbance: Secondary | ICD-10-CM

## 2017-01-31 DIAGNOSIS — F0151 Vascular dementia with behavioral disturbance: Secondary | ICD-10-CM

## 2017-01-31 DIAGNOSIS — M8000XS Age-related osteoporosis with current pathological fracture, unspecified site, sequela: Secondary | ICD-10-CM

## 2017-01-31 DIAGNOSIS — I10 Essential (primary) hypertension: Secondary | ICD-10-CM

## 2017-01-31 DIAGNOSIS — R69 Illness, unspecified: Secondary | ICD-10-CM | POA: Diagnosis not present

## 2017-01-31 DIAGNOSIS — E785 Hyperlipidemia, unspecified: Secondary | ICD-10-CM

## 2017-01-31 LAB — BASIC METABOLIC PANEL
BUN: 27 mg/dL — ABNORMAL HIGH (ref 6–23)
CALCIUM: 9.5 mg/dL (ref 8.4–10.5)
CO2: 32 meq/L (ref 19–32)
Chloride: 101 mEq/L (ref 96–112)
Creatinine, Ser: 0.76 mg/dL (ref 0.40–1.20)
GFR: 76.04 mL/min (ref >60.00)
GLUCOSE: 96 mg/dL (ref 70–99)
Potassium: 4.4 mEq/L (ref 3.5–5.1)
SODIUM: 140 meq/L (ref 135–145)

## 2017-01-31 NOTE — Patient Instructions (Signed)
[facility] 

## 2017-01-31 NOTE — Assessment & Plan Note (Signed)
Vit D 

## 2017-01-31 NOTE — Assessment & Plan Note (Signed)
On Lisinopril

## 2017-01-31 NOTE — Assessment & Plan Note (Signed)
Namzaric 

## 2017-01-31 NOTE — Assessment & Plan Note (Signed)
Lovastatin 

## 2017-01-31 NOTE — Progress Notes (Signed)
Subjective:  Patient ID: Regina Williams, female    DOB: June 03, 1925  Age: 83 y.o. MRN: [id number]  CC: No chief complaint on file.   HPI Regina Williams presents for HTN, depression, memory loss f/u  Outpatient Medications Prior to Visit  Medication Sig Dispense Refill  . aspirin 81 MG tablet Take 81 mg by mouth daily.      . cholecalciferol (VITAMIN D) 1000 UNITS tablet Take 2,000 Units by mouth daily.      Marland Kitchen FLUoxetine (PROZAC) 10 MG tablet Take 0.5 tablets (5 mg total) by mouth daily. 45 tablet 3  . hydrocortisone (ANUSOL-HC) 25 MG suppository Place 1 suppository (25 mg total) rectally at bedtime. 12 suppository 0  . lisinopril (PRINIVIL,ZESTRIL) 2.5 MG tablet Take 1 tablet (2.5 mg total) by mouth daily. 90 tablet 3  . loratadine (CLARITIN) 10 MG tablet Take 1 tablet (10 mg total) by mouth daily. (Patient taking differently: Take 10 mg by mouth daily as needed for allergies, rhinitis or itching. ) 300 tablet 1  . LORazepam (ATIVAN) 1 MG tablet TAKE ONE TABLET BY MOUTH TWICE DAILY AS NEEDED 60 tablet 3  . lovastatin (MEVACOR) 20 MG tablet TAKE ONE TABLET BY MOUTH EVERY DAY AT BEDTIME 90 tablet 3  . Memantine HCl-Donepezil HCl (NAMZARIC) 28-10 MG CP24 Take 1 tablet by mouth daily. 30 capsule 11  . Polyethyl Glycol-Propyl Glycol (SYSTANE PRESERVATIVE FREE) 0.4-0.3 % SOLN Place 1 drop into both eyes daily as needed (dry eyes).      Facility-Administered Medications Prior to Visit  Medication Dose Route Frequency Provider Last Rate Last Dose  . methylPREDNISolone acetate (DEPO-MEDROL) injection 40 mg  40 mg Intra-articular Once Plotnikov, Evie Lacks, MD        ROS Review of Systems  Constitutional: Negative for activity change, appetite change, chills, fatigue and unexpected weight change.  HENT: Negative for congestion, mouth sores and sinus pressure.   Eyes: Negative for visual disturbance.  Respiratory: Negative for cough and chest tightness.   Gastrointestinal: Negative for abdominal  pain and nausea.  Genitourinary: Negative for difficulty urinating, frequency and vaginal pain.  Musculoskeletal: Negative for back pain and gait problem.  Skin: Negative for pallor and rash.  Neurological: Negative for dizziness, tremors, weakness, numbness and headaches.  Psychiatric/Behavioral: Positive for confusion and decreased concentration. Negative for sleep disturbance. The patient is nervous/anxious.     Objective:  There were no vitals taken for this visit.  BP Readings from Last 3 Encounters:  07/27/16 130/70  06/22/16 110/62  06/20/16 146/69    Wt Readings from Last 3 Encounters:  07/27/16 92 lb (41.7 kg)  06/22/16 93 lb (42.2 kg)  06/20/16 94 lb (42.6 kg)    Physical Exam  Constitutional: She appears well-developed. No distress.  HENT:  Head: Normocephalic.  Right Ear: External ear normal.  Left Ear: External ear normal.  Nose: Nose normal.  Mouth/Throat: Oropharynx is clear and moist.  Eyes: Conjunctivae are normal. Pupils are equal, round, and reactive to light. Right eye exhibits no discharge. Left eye exhibits no discharge.  Neck: Normal range of motion. Neck supple. No JVD present. No tracheal deviation present. No thyromegaly present.  Cardiovascular: Normal rate, regular rhythm and normal heart sounds.   Pulmonary/Chest: No stridor. No respiratory distress. She has no wheezes.  Abdominal: Soft. Bowel sounds are normal. She exhibits no distension and no mass. There is no tenderness. There is no rebound and no guarding.  Musculoskeletal: She exhibits no edema or tenderness.  Lymphadenopathy:  She has no cervical adenopathy.  Neurological: She displays normal reflexes. No cranial nerve deficit. She exhibits normal muscle tone. Coordination normal.  Skin: No rash noted. No erythema.  Psychiatric: She has a normal mood and affect. Her behavior is normal. Thought content normal.  disoriented   Lab Results  Component Value Date   WBC 6.9 06/22/2016    HGB 13.3 06/22/2016   HCT 38.6 06/22/2016   PLT 224.0 06/22/2016   GLUCOSE 108 (H) 06/22/2016   CHOL 189 07/09/2013   TRIG 78.0 07/09/2013   HDL 81.80 07/09/2013   LDLCALC 92 07/09/2013   ALT 26 06/22/2016   AST 24 06/22/2016   NA 142 06/22/2016   K 3.8 06/22/2016   CL 104 06/22/2016   CREATININE 0.72 06/22/2016   BUN 26 (H) 06/22/2016   CO2 31 06/22/2016   TSH 1.18 06/22/2016    Ct Head Wo Contrast  Result Date: 06/22/2016 CLINICAL DATA:  83 year old female with acute confusional state. Vascular dementia. Initial encounter. EXAM: CT HEAD WITHOUT CONTRAST TECHNIQUE: Contiguous axial images were obtained from the base of the skull through the vertex without intravenous contrast. COMPARISON:  Head CT without contrast 04/09/2015. FINDINGS: Brain: Generalized cerebral volume loss appears not significantly changed. Gray-white matter differentiation appears normal for age throughout the brain. No cortical encephalomalacia identified. No midline shift, ventriculomegaly, mass effect, evidence of mass lesion, intracranial hemorrhage or evidence of cortically based acute infarction. Vascular: Calcified atherosclerosis at the skull base. No suspicious intracranial vascular hyperdensity. Skull: No acute osseous abnormality identified. Sinuses/Orbits: Clear. Other: No acute visible orbit or scalp soft tissue findings. IMPRESSION: No acute intracranial abnormality. Stable and negative noncontrast CT appearance of the brain aside from chronic generalized cerebral volume loss. Electronically Signed   By: Genevie Ann M.D.   On: 06/22/2016 14:10    Assessment & Plan:   There are no diagnoses linked to this encounter. I am having Regina Williams maintain her aspirin, Polyethyl Glycol-Propyl Glycol, cholecalciferol, hydrocortisone, loratadine, lisinopril, lovastatin, Memantine HCl-Donepezil HCl, FLUoxetine, and LORazepam. We will continue to administer methylPREDNISolone acetate.  No orders of the defined types were  placed in this encounter.    Follow-up: No Follow-up on file.  Walker Kehr, MD

## 2017-04-14 ENCOUNTER — Other Ambulatory Visit: Payer: Self-pay | Admitting: Internal Medicine

## 2017-04-20 ENCOUNTER — Other Ambulatory Visit: Payer: Self-pay | Admitting: Internal Medicine

## 2017-04-20 NOTE — Telephone Encounter (Signed)
Faxed

## 2017-04-20 NOTE — Telephone Encounter (Signed)
Done hardcopy to Shirron  

## 2017-04-20 NOTE — Telephone Encounter (Signed)
Please advise in Dr. Enis Slipper, absence

## 2017-05-22 ENCOUNTER — Other Ambulatory Visit: Payer: Self-pay | Admitting: Internal Medicine

## 2017-05-24 ENCOUNTER — Other Ambulatory Visit: Payer: Self-pay | Admitting: Internal Medicine

## 2017-05-26 NOTE — Telephone Encounter (Signed)
Pt daughter called in and pt needs refills on her  Lorazepam lisinorpril    Pharmacy on file

## 2017-05-28 ENCOUNTER — Telehealth: Payer: Self-pay | Admitting: Internal Medicine

## 2017-05-29 MED ORDER — LORAZEPAM 1 MG PO TABS: 1.0000 mg | ORAL_TABLET | Freq: Two times a day (BID) | ORAL | 2 refills | Status: DC | PRN

## 2017-05-29 NOTE — Telephone Encounter (Signed)
Done hardcopy to Shirron  

## 2017-05-30 NOTE — Telephone Encounter (Signed)
Faxed

## 2017-05-30 NOTE — Telephone Encounter (Signed)
Pt's daughter called checking on this prescription. I let her know that it was filled late last night but should be sent over to the pharmacy this morning.

## 2017-07-14 ENCOUNTER — Other Ambulatory Visit: Payer: Self-pay | Admitting: Internal Medicine

## 2017-08-01 ENCOUNTER — Encounter: Payer: Self-pay | Admitting: Internal Medicine

## 2017-08-01 ENCOUNTER — Ambulatory Visit (INDEPENDENT_AMBULATORY_CARE_PROVIDER_SITE_OTHER): Payer: Medicare HMO | Admitting: Internal Medicine

## 2017-08-01 VITALS — BP 114/64 | HR 70 | Temp 97.9°F | Ht 60.0 in | Wt 91.0 lb

## 2017-08-01 DIAGNOSIS — F0151 Vascular dementia with behavioral disturbance: Secondary | ICD-10-CM | POA: Diagnosis not present

## 2017-08-01 DIAGNOSIS — F411 Generalized anxiety disorder: Secondary | ICD-10-CM

## 2017-08-01 DIAGNOSIS — E785 Hyperlipidemia, unspecified: Secondary | ICD-10-CM

## 2017-08-01 DIAGNOSIS — Z23 Encounter for immunization: Secondary | ICD-10-CM

## 2017-08-01 DIAGNOSIS — F01518 Vascular dementia, unspecified severity, with other behavioral disturbance: Secondary | ICD-10-CM

## 2017-08-01 DIAGNOSIS — R69 Illness, unspecified: Secondary | ICD-10-CM | POA: Diagnosis not present

## 2017-08-01 MED ORDER — FLUOXETINE HCL 10 MG PO TABS: 5.0000 mg | ORAL_TABLET | Freq: Every day | ORAL | 3 refills | Status: AC

## 2017-08-01 MED ORDER — LORAZEPAM 1 MG PO TABS: 1.0000 mg | ORAL_TABLET | Freq: Two times a day (BID) | ORAL | 3 refills | Status: DC | PRN

## 2017-08-01 NOTE — Patient Instructions (Signed)
[facility] 

## 2017-08-01 NOTE — Assessment & Plan Note (Signed)
lovastatin

## 2017-08-01 NOTE — Addendum Note (Signed)
Addended by: Karren Cobble on: 08/01/2017 02:12 PM   Modules accepted: Orders

## 2017-08-01 NOTE — Assessment & Plan Note (Signed)
Namzaric 

## 2017-08-01 NOTE — Assessment & Plan Note (Signed)
On Lorazepam prn Prozac qd 

## 2017-08-01 NOTE — Progress Notes (Signed)
Subjective:  Patient ID: Regina Williams, female    DOB: Jul 28, 1924  Age: 84 y.o. MRN: [id number]  CC: No chief complaint on file.   HPI Regina Williams presents for HTN, dementia, anxiety f/u  Outpatient Medications Prior to Visit  Medication Sig Dispense Refill  . aspirin 81 MG tablet Take 81 mg by mouth daily.      . cholecalciferol (VITAMIN D) 1000 UNITS tablet Take 2,000 Units by mouth daily.      . hydrocortisone (ANUSOL-HC) 25 MG suppository Place 1 suppository (25 mg total) rectally at bedtime. 12 suppository 0  . lisinopril (PRINIVIL,ZESTRIL) 2.5 MG tablet Take 1 tablet (2.5 mg total) by mouth daily. 90 tablet 3  . lisinopril (PRINIVIL,ZESTRIL) 2.5 MG tablet TAKE ONE TABLET BY MOUTH ONCE DAILY 90 tablet 2  . loratadine (CLARITIN) 10 MG tablet Take 1 tablet (10 mg total) by mouth daily. (Patient taking differently: Take 10 mg by mouth daily as needed for allergies, rhinitis or itching. ) 300 tablet 1  . lovastatin (MEVACOR) 20 MG tablet TAKE ONE TABLET BY MOUTH ONCE DAILY AT BEDTIME 90 tablet 3  . NAMZARIC 28-10 MG CP24 TAKE ONE CAPSULE BY MOUTH ONCE DAILY 30 capsule 11  . Polyethyl Glycol-Propyl Glycol (SYSTANE PRESERVATIVE FREE) 0.4-0.3 % SOLN Place 1 drop into both eyes daily as needed (dry eyes).     Marland Kitchen FLUoxetine (PROZAC) 10 MG tablet Take 0.5 tablets (5 mg total) by mouth daily. 45 tablet 3  . LORazepam (ATIVAN) 1 MG tablet Take 1 tablet (1 mg total) 2 (two) times daily as needed by mouth. 60 tablet 2   Facility-Administered Medications Prior to Visit  Medication Dose Route Frequency Provider Last Rate Last Dose  . methylPREDNISolone acetate (DEPO-MEDROL) injection 40 mg  40 mg Intra-articular Once Ahonesty Woodfin, Evie Lacks, MD        ROS Review of Systems  Constitutional: Negative for activity change, appetite change, chills, fatigue and unexpected weight change.  HENT: Negative for congestion, mouth sores and sinus pressure.   Eyes: Negative for visual disturbance.    Respiratory: Negative for cough and chest tightness.   Gastrointestinal: Negative for abdominal pain and nausea.  Genitourinary: Negative for difficulty urinating, frequency and vaginal pain.  Musculoskeletal: Negative for back pain and gait problem.  Skin: Negative for pallor and rash.  Neurological: Negative for dizziness, tremors, weakness, numbness and headaches.  Psychiatric/Behavioral: Positive for decreased concentration. Negative for confusion, sleep disturbance and suicidal ideas. The patient is nervous/anxious.     Objective:  BP 114/64 (BP Location: Left Arm, Patient Position: Sitting, Cuff Size: Normal)   Pulse 70   Temp 97.9 F (36.6 C) (Oral)   Ht 5' (1.524 m)   Wt 91 lb (41.3 kg)   SpO2 98%   BMI 17.77 kg/m   BP Readings from Last 3 Encounters:  08/01/17 114/64  01/31/17 116/72  07/27/16 130/70    Wt Readings from Last 3 Encounters:  08/01/17 91 lb (41.3 kg)  01/31/17 90 lb (40.8 kg)  07/27/16 92 lb (41.7 kg)    Physical Exam  Constitutional: She appears well-developed. No distress.  HENT:  Head: Normocephalic.  Right Ear: External ear normal.  Left Ear: External ear normal.  Nose: Nose normal.  Mouth/Throat: Oropharynx is clear and moist.  Eyes: Conjunctivae are normal. Pupils are equal, round, and reactive to light. Right eye exhibits no discharge. Left eye exhibits no discharge.  Neck: Normal range of motion. Neck supple. No JVD present. No tracheal deviation  present. No thyromegaly present.  Cardiovascular: Normal rate, regular rhythm and normal heart sounds.  Pulmonary/Chest: No stridor. No respiratory distress. She has no wheezes.  Abdominal: Soft. Bowel sounds are normal. She exhibits no distension and no mass. There is no tenderness. There is no rebound and no guarding.  Musculoskeletal: She exhibits no edema or tenderness.  Lymphadenopathy:    She has no cervical adenopathy.  Neurological: She displays normal reflexes. No cranial nerve  deficit. She exhibits normal muscle tone. Coordination normal.  Skin: No rash noted. No erythema.  Psychiatric: She has a normal mood and affect. Her behavior is normal. Thought content normal.  alert cooperative Ataxic   Lab Results  Component Value Date   WBC 6.9 06/22/2016   HGB 13.3 06/22/2016   HCT 38.6 06/22/2016   PLT 224.0 06/22/2016   GLUCOSE 96 01/31/2017   CHOL 189 07/09/2013   TRIG 78.0 07/09/2013   HDL 81.80 07/09/2013   LDLCALC 92 07/09/2013   ALT 26 06/22/2016   AST 24 06/22/2016   NA 140 01/31/2017   K 4.4 01/31/2017   CL 101 01/31/2017   CREATININE 0.76 01/31/2017   BUN 27 (H) 01/31/2017   CO2 32 01/31/2017   TSH 1.18 06/22/2016    Ct Head Wo Contrast  Result Date: 06/22/2016 CLINICAL DATA:  84 year old female with acute confusional state. Vascular dementia. Initial encounter. EXAM: CT HEAD WITHOUT CONTRAST TECHNIQUE: Contiguous axial images were obtained from the base of the skull through the vertex without intravenous contrast. COMPARISON:  Head CT without contrast 04/09/2015. FINDINGS: Brain: Generalized cerebral volume loss appears not significantly changed. Gray-white matter differentiation appears normal for age throughout the brain. No cortical encephalomalacia identified. No midline shift, ventriculomegaly, mass effect, evidence of mass lesion, intracranial hemorrhage or evidence of cortically based acute infarction. Vascular: Calcified atherosclerosis at the skull base. No suspicious intracranial vascular hyperdensity. Skull: No acute osseous abnormality identified. Sinuses/Orbits: Clear. Other: No acute visible orbit or scalp soft tissue findings. IMPRESSION: No acute intracranial abnormality. Stable and negative noncontrast CT appearance of the brain aside from chronic generalized cerebral volume loss. Electronically Signed   By: Genevie Ann M.D.   On: 06/22/2016 14:10    Assessment & Plan:   There are no diagnoses linked to this encounter. I have changed  Regina Champagne. Williams's LORazepam. I am also having her maintain her aspirin, Polyethyl Glycol-Propyl Glycol, cholecalciferol, hydrocortisone, loratadine, lisinopril, lovastatin, lisinopril, NAMZARIC, and FLUoxetine. We will continue to administer methylPREDNISolone acetate.  Meds ordered this encounter  Medications  . FLUoxetine (PROZAC) 10 MG tablet    Sig: Take 0.5 tablets (5 mg total) by mouth daily.    Dispense:  45 tablet    Refill:  3  . LORazepam (ATIVAN) 1 MG tablet    Sig: Take 1 tablet (1 mg total) by mouth 2 (two) times daily as needed.    Dispense:  60 tablet    Refill:  3     Follow-up: No Follow-up on file.  Walker Kehr, MD

## 2017-10-16 ENCOUNTER — Ambulatory Visit (INDEPENDENT_AMBULATORY_CARE_PROVIDER_SITE_OTHER): Payer: Medicare HMO | Admitting: Internal Medicine

## 2017-10-16 ENCOUNTER — Other Ambulatory Visit (INDEPENDENT_AMBULATORY_CARE_PROVIDER_SITE_OTHER): Payer: Medicare HMO

## 2017-10-16 ENCOUNTER — Encounter: Payer: Self-pay | Admitting: Internal Medicine

## 2017-10-16 VITALS — BP 120/62 | HR 56 | Temp 97.8°F | Ht 60.0 in | Wt 90.0 lb

## 2017-10-16 DIAGNOSIS — Z111 Encounter for screening for respiratory tuberculosis: Secondary | ICD-10-CM | POA: Diagnosis not present

## 2017-10-16 DIAGNOSIS — Z23 Encounter for immunization: Secondary | ICD-10-CM

## 2017-10-16 DIAGNOSIS — E785 Hyperlipidemia, unspecified: Secondary | ICD-10-CM

## 2017-10-16 DIAGNOSIS — F4323 Adjustment disorder with mixed anxiety and depressed mood: Secondary | ICD-10-CM

## 2017-10-16 DIAGNOSIS — I10 Essential (primary) hypertension: Secondary | ICD-10-CM | POA: Diagnosis not present

## 2017-10-16 DIAGNOSIS — F0151 Vascular dementia with behavioral disturbance: Secondary | ICD-10-CM

## 2017-10-16 DIAGNOSIS — F01518 Vascular dementia, unspecified severity, with other behavioral disturbance: Secondary | ICD-10-CM

## 2017-10-16 DIAGNOSIS — R69 Illness, unspecified: Secondary | ICD-10-CM | POA: Diagnosis not present

## 2017-10-16 DIAGNOSIS — F411 Generalized anxiety disorder: Secondary | ICD-10-CM

## 2017-10-16 LAB — TSH: TSH: 1.9 u[IU]/mL (ref 0.35–4.50)

## 2017-10-16 LAB — HEPATIC FUNCTION PANEL
ALBUMIN: 4.1 g/dL (ref 3.5–5.2)
ALT: 27 U/L (ref 0–35)
AST: 21 U/L (ref 0–37)
Alkaline Phosphatase: 53 U/L (ref 39–117)
BILIRUBIN DIRECT: 0.1 mg/dL (ref 0.0–0.3)
Total Bilirubin: 0.7 mg/dL (ref 0.2–1.2)
Total Protein: 6.8 g/dL (ref 6.0–8.3)

## 2017-10-16 LAB — BASIC METABOLIC PANEL
BUN: 23 mg/dL (ref 6–23)
CHLORIDE: 102 meq/L (ref 96–112)
CO2: 33 mEq/L — ABNORMAL HIGH (ref 19–32)
Calcium: 9.2 mg/dL (ref 8.4–10.5)
Creatinine, Ser: 0.62 mg/dL (ref 0.40–1.20)
GFR: 96.03 mL/min (ref >60.00)
Glucose, Bld: 102 mg/dL — ABNORMAL HIGH (ref 70–99)
POTASSIUM: 4.1 meq/L (ref 3.5–5.1)
SODIUM: 141 meq/L (ref 135–145)

## 2017-10-16 NOTE — Assessment & Plan Note (Signed)
Potential benefits of a long term benzodiazepines  use as well as potential risks  and complications were explained to the patient and were aknowledged.   On Lorazepam prn Prozac qd

## 2017-10-16 NOTE — Assessment & Plan Note (Signed)
Doing ok.

## 2017-10-16 NOTE — Assessment & Plan Note (Signed)
Planning to move to assisted living - [facility] in [place]

## 2017-10-16 NOTE — Progress Notes (Signed)
Subjective:  Patient ID: Regina Williams, female    DOB: 03-22-1925  Age: 84 y.o. MRN: [id number]  CC: No chief complaint on file.   HPI CHANDA LAPERLE presents for dementia, anxiety, anxiety Planning to move to assisted living - [facility] in [place]  Outpatient Medications Prior to Visit  Medication Sig Dispense Refill  . aspirin 81 MG tablet Take 81 mg by mouth daily.      Marland Kitchen FLUoxetine (PROZAC) 10 MG tablet Take 0.5 tablets (5 mg total) by mouth daily. 45 tablet 3  . hydrocortisone (ANUSOL-HC) 25 MG suppository Place 1 suppository (25 mg total) rectally at bedtime. (Patient taking differently: Place 25 mg rectally at bedtime as needed. ) 12 suppository 0  . lisinopril (PRINIVIL,ZESTRIL) 2.5 MG tablet Take 1 tablet (2.5 mg total) by mouth daily. 90 tablet 3  . LORazepam (ATIVAN) 1 MG tablet Take 1 tablet (1 mg total) by mouth 2 (two) times daily as needed. 60 tablet 3  . lovastatin (MEVACOR) 20 MG tablet TAKE ONE TABLET BY MOUTH ONCE DAILY AT BEDTIME 90 tablet 3  . NAMZARIC 28-10 MG CP24 TAKE ONE CAPSULE BY MOUTH ONCE DAILY 30 capsule 11  . cholecalciferol (VITAMIN D) 1000 UNITS tablet Take 2,000 Units by mouth daily.      Marland Kitchen lisinopril (PRINIVIL,ZESTRIL) 2.5 MG tablet TAKE ONE TABLET BY MOUTH ONCE DAILY 90 tablet 2  . loratadine (CLARITIN) 10 MG tablet Take 1 tablet (10 mg total) by mouth daily. (Patient taking differently: Take 10 mg by mouth daily as needed for allergies, rhinitis or itching. ) 300 tablet 1  . Polyethyl Glycol-Propyl Glycol (SYSTANE PRESERVATIVE FREE) 0.4-0.3 % SOLN Place 1 drop into both eyes daily as needed (dry eyes).      Facility-Administered Medications Prior to Visit  Medication Dose Route Frequency Provider Last Rate Last Dose  . methylPREDNISolone acetate (DEPO-MEDROL) injection 40 mg  40 mg Intra-articular Once Teyana Pierron, Evie Lacks, MD        ROS Review of Systems  Constitutional: Negative for activity change, appetite change, chills, fatigue and  unexpected weight change.  HENT: Negative for congestion, mouth sores and sinus pressure.   Eyes: Negative for visual disturbance.  Respiratory: Negative for cough and chest tightness.   Gastrointestinal: Negative for abdominal pain and nausea.  Genitourinary: Negative for difficulty urinating, frequency and vaginal pain.  Musculoskeletal: Negative for back pain and gait problem.  Skin: Negative for pallor and rash.  Neurological: Negative for dizziness, tremors, weakness, numbness and headaches.  Psychiatric/Behavioral: Positive for confusion and decreased concentration. Negative for sleep disturbance.    Objective:  BP 120/62 (BP Location: Left Arm, Patient Position: Sitting, Cuff Size: Normal)   Pulse (!) 56   Temp 97.8 F (36.6 C) (Oral)   Ht 5' (1.524 m)   Wt 90 lb (40.8 kg)   SpO2 98%   BMI 17.58 kg/m   BP Readings from Last 3 Encounters:  10/16/17 120/62  08/01/17 114/64  01/31/17 116/72    Wt Readings from Last 3 Encounters:  10/16/17 90 lb (40.8 kg)  08/01/17 91 lb (41.3 kg)  01/31/17 90 lb (40.8 kg)    Physical Exam  Constitutional: She appears well-developed. No distress.  HENT:  Head: Normocephalic.  Right Ear: External ear normal.  Left Ear: External ear normal.  Nose: Nose normal.  Mouth/Throat: Oropharynx is clear and moist.  Eyes: Pupils are equal, round, and reactive to light. Conjunctivae are normal. Right eye exhibits no discharge. Left eye exhibits no discharge.  Neck: Normal range of motion. Neck supple. No JVD present. No tracheal deviation present. No thyromegaly present.  Cardiovascular: Normal rate, regular rhythm and normal heart sounds.  Pulmonary/Chest: No stridor. No respiratory distress. She has no wheezes.  Abdominal: Soft. Bowel sounds are normal. She exhibits no distension and no mass. There is no tenderness. There is no rebound and no guarding.  Musculoskeletal: She exhibits no edema or tenderness.  Lymphadenopathy:    She has no  cervical adenopathy.  Neurological: She displays normal reflexes. No cranial nerve deficit. She exhibits normal muscle tone. Coordination normal.  Skin: No rash noted. No erythema.  Psychiatric: She has a normal mood and affect. Her behavior is normal.  disoriented  Form filled out FTF>20 min  Lab Results  Component Value Date   WBC 6.9 06/22/2016   HGB 13.3 06/22/2016   HCT 38.6 06/22/2016   PLT 224.0 06/22/2016   GLUCOSE 96 01/31/2017   CHOL 189 07/09/2013   TRIG 78.0 07/09/2013   HDL 81.80 07/09/2013   LDLCALC 92 07/09/2013   ALT 26 06/22/2016   AST 24 06/22/2016   NA 140 01/31/2017   K 4.4 01/31/2017   CL 101 01/31/2017   CREATININE 0.76 01/31/2017   BUN 27 (H) 01/31/2017   CO2 32 01/31/2017   TSH 1.18 06/22/2016    Ct Head Wo Contrast  Result Date: 06/22/2016 CLINICAL DATA:  84 year old female with acute confusional state. Vascular dementia. Initial encounter. EXAM: CT HEAD WITHOUT CONTRAST TECHNIQUE: Contiguous axial images were obtained from the base of the skull through the vertex without intravenous contrast. COMPARISON:  Head CT without contrast 04/09/2015. FINDINGS: Brain: Generalized cerebral volume loss appears not significantly changed. Gray-white matter differentiation appears normal for age throughout the brain. No cortical encephalomalacia identified. No midline shift, ventriculomegaly, mass effect, evidence of mass lesion, intracranial hemorrhage or evidence of cortically based acute infarction. Vascular: Calcified atherosclerosis at the skull base. No suspicious intracranial vascular hyperdensity. Skull: No acute osseous abnormality identified. Sinuses/Orbits: Clear. Other: No acute visible orbit or scalp soft tissue findings. IMPRESSION: No acute intracranial abnormality. Stable and negative noncontrast CT appearance of the brain aside from chronic generalized cerebral volume loss. Electronically Signed   By: Genevie Ann M.D.   On: 06/22/2016 14:10    Assessment &  Plan:   There are no diagnoses linked to this encounter. I have discontinued Kerrin Champagne. Grieco's Polyethyl Glycol-Propyl Glycol, cholecalciferol, and loratadine. I am also having her maintain her aspirin, hydrocortisone, lisinopril, lovastatin, NAMZARIC, FLUoxetine, and LORazepam. We will continue to administer methylPREDNISolone acetate.  No orders of the defined types were placed in this encounter.    Follow-up: No follow-ups on file.  Walker Kehr, MD

## 2017-10-16 NOTE — Assessment & Plan Note (Signed)
Lisinopril 

## 2017-10-16 NOTE — Addendum Note (Signed)
Addended by: Karren Cobble on: 10/16/2017 03:17 PM   Modules accepted: Orders

## 2017-10-17 ENCOUNTER — Telehealth: Payer: Self-pay | Admitting: Internal Medicine

## 2017-10-17 NOTE — Telephone Encounter (Signed)
Copied from [place] #[id number]. Topic: Inquiry >> Oct 17, 2017 10:00 AM Margot Ables wrote: Reason for CRM: pt was in office yesterday for TB and an FL2 was brought in. Robley Fries is requesting a faxed copy once completed to check everything prior to pts move in. Fax # [phone]

## 2017-10-18 ENCOUNTER — Other Ambulatory Visit (INDEPENDENT_AMBULATORY_CARE_PROVIDER_SITE_OTHER): Payer: Medicare HMO

## 2017-10-18 DIAGNOSIS — F411 Generalized anxiety disorder: Secondary | ICD-10-CM

## 2017-10-18 DIAGNOSIS — R69 Illness, unspecified: Secondary | ICD-10-CM | POA: Diagnosis not present

## 2017-10-18 LAB — URINALYSIS, ROUTINE W REFLEX MICROSCOPIC
Bilirubin Urine: NEGATIVE
Hgb urine dipstick: NEGATIVE
KETONES UR: NEGATIVE
Nitrite: NEGATIVE
RBC / HPF: NONE SEEN (ref 0–?)
SPECIFIC GRAVITY, URINE: 1.015 (ref 1.000–1.030)
Total Protein, Urine: NEGATIVE
Urine Glucose: NEGATIVE
Urobilinogen, UA: 0.2 (ref 0.0–1.0)
pH: 6 (ref 5.0–8.0)

## 2017-10-18 LAB — TB SKIN TEST
Induration: 0 mm
TB SKIN TEST: NEGATIVE

## 2017-10-18 NOTE — Telephone Encounter (Signed)
Forms faxed

## 2017-11-16 ENCOUNTER — Other Ambulatory Visit: Payer: Self-pay

## 2017-11-16 MED ORDER — ASPIRIN 81 MG PO TABS: 81.0000 mg | ORAL_TABLET | Freq: Every day | ORAL | 11 refills | Status: AC

## 2017-11-16 MED ORDER — LORAZEPAM 1 MG PO TABS: 1.0000 mg | ORAL_TABLET | Freq: Two times a day (BID) | ORAL | 3 refills | Status: DC | PRN

## 2017-11-20 DIAGNOSIS — N390 Urinary tract infection, site not specified: Secondary | ICD-10-CM | POA: Diagnosis not present

## 2017-12-07 ENCOUNTER — Telehealth: Payer: Self-pay | Admitting: Internal Medicine

## 2017-12-07 NOTE — Telephone Encounter (Unsigned)
Copied from [place] [id number]. Topic: Quick Communication - See Telephone Encounter >> Dec 07, 2017  2:55 PM Neva Seat wrote: [place]  [phone] w/ [facility] in [place] 9 pg form on Wed.  [place] PSA/PSP - Needing to be signed (just the signature page)  and faxed - [phone].

## 2017-12-11 ENCOUNTER — Telehealth: Payer: Self-pay | Admitting: Internal Medicine

## 2017-12-11 NOTE — Telephone Encounter (Signed)
Copied from [place] [phone] [phone]. Topic: Quick Communication - See Telephone Encounter >> Dec 07, 2017  2:55 PM Neva Seat wrote: Arleta Creek  [phone] w/ [facility] in [place] 9 pg form on Wed.  [place] PSA/PSP - Needing to be signed (just the signature page)  and faxed - [phone]. >> Dec 11, 2017  8:30 AM Cleaster Corin, NT wrote: Ar[place]ta [place] calling back [phone] w/ [facility] in [place]  Sent 9 pg form on Wed.  [place] PSA/PSP - Needing to be signed (just the signature page)  and faxed - [phone].  Needing to be faxed over today if possible being  surveyed today

## 2017-12-11 NOTE — Telephone Encounter (Signed)
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

## 2017-12-13 NOTE — Telephone Encounter (Signed)
Tracey with [facility] is calling back to get an update on forms being signed. 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 Need to sign at the top of the PSP page and at the bottom. 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 CB#: [phone]. Fax#: [phone]

## 2017-12-13 NOTE — Telephone Encounter (Signed)
faxed

## 2018-01-26 ENCOUNTER — Telehealth: Payer: Self-pay | Admitting: Internal Medicine

## 2018-01-26 DIAGNOSIS — R69 Illness, unspecified: Secondary | ICD-10-CM | POA: Diagnosis not present

## 2018-01-26 DIAGNOSIS — Z7982 Long term (current) use of aspirin: Secondary | ICD-10-CM | POA: Diagnosis not present

## 2018-01-26 DIAGNOSIS — I10 Essential (primary) hypertension: Secondary | ICD-10-CM | POA: Diagnosis not present

## 2018-01-26 DIAGNOSIS — R262 Difficulty in walking, not elsewhere classified: Secondary | ICD-10-CM | POA: Diagnosis not present

## 2018-01-26 NOTE — Telephone Encounter (Signed)
Copied from [place] [id number]. Topic: Inquiry >> Jan 26, 2018  2:07 PM Pricilla Handler wrote: Reason for CRM: Amy with [facility] [phone]) called requesting PT Verbal Orders: PT Twice a Week for 4 Weeks and Once a Week for 1 Week for Gait Imbalance. Please call Amy with [facility] at [phone].       Thank You!!!

## 2018-01-26 NOTE — Telephone Encounter (Signed)
Verbal orders given for below.  

## 2018-01-30 ENCOUNTER — Encounter: Payer: Self-pay | Admitting: Internal Medicine

## 2018-01-30 ENCOUNTER — Ambulatory Visit (INDEPENDENT_AMBULATORY_CARE_PROVIDER_SITE_OTHER): Payer: Medicare HMO | Admitting: Internal Medicine

## 2018-01-30 DIAGNOSIS — I10 Essential (primary) hypertension: Secondary | ICD-10-CM

## 2018-01-30 DIAGNOSIS — F01518 Vascular dementia, unspecified severity, with other behavioral disturbance: Secondary | ICD-10-CM

## 2018-01-30 DIAGNOSIS — Z7982 Long term (current) use of aspirin: Secondary | ICD-10-CM | POA: Diagnosis not present

## 2018-01-30 DIAGNOSIS — F411 Generalized anxiety disorder: Secondary | ICD-10-CM

## 2018-01-30 DIAGNOSIS — F0151 Vascular dementia with behavioral disturbance: Secondary | ICD-10-CM | POA: Diagnosis not present

## 2018-01-30 DIAGNOSIS — R69 Illness, unspecified: Secondary | ICD-10-CM | POA: Diagnosis not present

## 2018-01-30 DIAGNOSIS — R262 Difficulty in walking, not elsewhere classified: Secondary | ICD-10-CM | POA: Diagnosis not present

## 2018-01-30 MED ORDER — LORAZEPAM 1 MG PO TABS: 1.0000 mg | ORAL_TABLET | Freq: Two times a day (BID) | ORAL | 3 refills | Status: DC | PRN

## 2018-01-30 NOTE — Assessment & Plan Note (Signed)
In [place] now PT

## 2018-01-30 NOTE — Assessment & Plan Note (Signed)
Lorazepam prn Prozac

## 2018-01-30 NOTE — Progress Notes (Signed)
Subjective:  Patient ID: Regina Williams, female    DOB: [birth year]  Age: 84 y.o. MRN: [id number]  CC: No chief complaint on file.   HPI DRUE CAMERA presents for memory loss, HTN, depression f/u Moved to [facility] - [facility] in [place] 3/19  Outpatient Medications Prior to Visit  Medication Sig Dispense Refill  . aspirin 81 MG tablet Take 1 tablet (81 mg total) by mouth daily. 30 tablet 11  . FLUoxetine (PROZAC) 10 MG tablet Take 0.5 tablets (5 mg total) by mouth daily. 45 tablet 3  . hydrocortisone (ANUSOL-HC) 25 MG suppository Place 1 suppository (25 mg total) rectally at bedtime. (Patient taking differently: Place 25 mg rectally at bedtime as needed. ) 12 suppository 0  . lisinopril (PRINIVIL,ZESTRIL) 2.5 MG tablet Take 1 tablet (2.5 mg total) by mouth daily. 90 tablet 3  . LORazepam (ATIVAN) 1 MG tablet Take 1 tablet (1 mg total) by mouth 2 (two) times daily as needed. 60 tablet 3  . lovastatin (MEVACOR) 20 MG tablet TAKE ONE TABLET BY MOUTH ONCE DAILY AT BEDTIME 90 tablet 3  . NAMZARIC 28-10 MG CP24 TAKE ONE CAPSULE BY MOUTH ONCE DAILY 30 capsule 11   Facility-Administered Medications Prior to Visit  Medication Dose Route Frequency Provider Last Rate Last Dose  . methylPREDNISolone acetate (DEPO-MEDROL) injection 40 mg  40 mg Intra-articular Once Elmire Amrein, Evie Lacks, MD        ROS: Review of Systems  Constitutional: Negative for activity change, appetite change, chills, fatigue and unexpected weight change.  HENT: Negative for congestion, mouth sores and sinus pressure.   Eyes: Negative for visual disturbance.  Respiratory: Negative for cough and chest tightness.   Gastrointestinal: Negative for abdominal pain and nausea.  Genitourinary: Negative for difficulty urinating, frequency and vaginal pain.  Musculoskeletal: Negative for back pain and gait problem.  Skin: Negative for pallor and rash.  Neurological: Negative for dizziness, tremors, weakness, numbness and  headaches.  Psychiatric/Behavioral: Positive for confusion and decreased concentration. Negative for sleep disturbance and suicidal ideas.    Objective:  BP 122/68 (BP Location: Left Arm, Patient Position: Sitting, Cuff Size: Normal)   Pulse (!) 59   Temp 97.9 F (36.6 C) (Oral)   Ht 5' (1.524 m)   Wt 98 lb (44.5 kg)   SpO2 97%   BMI 19.14 kg/m   BP Readings from Last 3 Encounters:  01/30/18 122/68  10/16/17 120/62  08/01/17 114/64    Wt Readings from Last 3 Encounters:  01/30/18 98 lb (44.5 kg)  10/16/17 90 lb (40.8 kg)  08/01/17 91 lb (41.3 kg)    Physical Exam  Constitutional: She appears well-developed. No distress.  HENT:  Head: Normocephalic.  Right Ear: External ear normal.  Left Ear: External ear normal.  Nose: Nose normal.  Mouth/Throat: Oropharynx is clear and moist.  Eyes: Pupils are equal, round, and reactive to light. Conjunctivae are normal. Right eye exhibits no discharge. Left eye exhibits no discharge.  Neck: Normal range of motion. Neck supple. No JVD present. No tracheal deviation present. No thyromegaly present.  Cardiovascular: Normal rate, regular rhythm and normal heart sounds.  Pulmonary/Chest: No stridor. No respiratory distress. She has no wheezes.  Abdominal: Soft. Bowel sounds are normal. She exhibits no distension and no mass. There is no tenderness. There is no rebound and no guarding.  Musculoskeletal: She exhibits no edema or tenderness.  Lymphadenopathy:    She has no cervical adenopathy.  Neurological: She displays normal reflexes. No cranial  nerve deficit. She exhibits normal muscle tone. Coordination normal.  Skin: No rash noted. No erythema.  Psychiatric: She has a normal mood and affect. Her behavior is normal. Thought content normal.  confused Ataxic  Lab Results  Component Value Date   WBC 6.9 06/22/2016   HGB 13.3 06/22/2016   HCT 38.6 06/22/2016   PLT 224.0 06/22/2016   GLUCOSE 102 (H) 10/16/2017   CHOL 189 07/09/2013     TRIG 78.0 07/09/2013   HDL 81.80 07/09/2013   LDLCALC 92 07/09/2013   ALT 27 10/16/2017   AST 21 10/16/2017   NA 141 10/16/2017   K 4.1 10/16/2017   CL 102 10/16/2017   CREATININE 0.62 10/16/2017   BUN 23 10/16/2017   CO2 33 (H) 10/16/2017   TSH 1.90 10/16/2017    Ct Head Wo Contrast  Result Date: 06/22/2016 CLINICAL DATA:  84 year old female with acute confusional state. Vascular dementia. Initial encounter. EXAM: CT HEAD WITHOUT CONTRAST TECHNIQUE: Contiguous axial images were obtained from the base of the skull through the vertex without intravenous contrast. COMPARISON:  Head CT without contrast 04/09/2015. FINDINGS: Brain: Generalized cerebral volume loss appears not significantly changed. Gray-white matter differentiation appears normal for age throughout the brain. No cortical encephalomalacia identified. No midline shift, ventriculomegaly, mass effect, evidence of mass lesion, intracranial hemorrhage or evidence of cortically based acute infarction. Vascular: Calcified atherosclerosis at the skull base. No suspicious intracranial vascular hyperdensity. Skull: No acute osseous abnormality identified. Sinuses/Orbits: Clear. Other: No acute visible orbit or scalp soft tissue findings. IMPRESSION: No acute intracranial abnormality. Stable and negative noncontrast CT appearance of the brain aside from chronic generalized cerebral volume loss. Electronically Signed   By: Genevie Ann M.D.   On: 06/22/2016 14:10    Assessment & Plan:   There are no diagnoses linked to this encounter.   No orders of the defined types were placed in this encounter.    Follow-up: No follow-ups on file.  Walker Kehr, MD

## 2018-01-30 NOTE — Assessment & Plan Note (Signed)
Lisinopril 

## 2018-02-02 DIAGNOSIS — I10 Essential (primary) hypertension: Secondary | ICD-10-CM | POA: Diagnosis not present

## 2018-02-02 DIAGNOSIS — Z7982 Long term (current) use of aspirin: Secondary | ICD-10-CM | POA: Diagnosis not present

## 2018-02-02 DIAGNOSIS — R69 Illness, unspecified: Secondary | ICD-10-CM | POA: Diagnosis not present

## 2018-02-02 DIAGNOSIS — R262 Difficulty in walking, not elsewhere classified: Secondary | ICD-10-CM | POA: Diagnosis not present

## 2018-02-05 DIAGNOSIS — R69 Illness, unspecified: Secondary | ICD-10-CM | POA: Diagnosis not present

## 2018-02-05 DIAGNOSIS — Z7982 Long term (current) use of aspirin: Secondary | ICD-10-CM | POA: Diagnosis not present

## 2018-02-05 DIAGNOSIS — R262 Difficulty in walking, not elsewhere classified: Secondary | ICD-10-CM | POA: Diagnosis not present

## 2018-02-05 DIAGNOSIS — I10 Essential (primary) hypertension: Secondary | ICD-10-CM | POA: Diagnosis not present

## 2018-02-07 DIAGNOSIS — Z7982 Long term (current) use of aspirin: Secondary | ICD-10-CM | POA: Diagnosis not present

## 2018-02-07 DIAGNOSIS — R69 Illness, unspecified: Secondary | ICD-10-CM | POA: Diagnosis not present

## 2018-02-07 DIAGNOSIS — R262 Difficulty in walking, not elsewhere classified: Secondary | ICD-10-CM | POA: Diagnosis not present

## 2018-02-07 DIAGNOSIS — I10 Essential (primary) hypertension: Secondary | ICD-10-CM | POA: Diagnosis not present

## 2018-02-12 DIAGNOSIS — Z7982 Long term (current) use of aspirin: Secondary | ICD-10-CM | POA: Diagnosis not present

## 2018-02-12 DIAGNOSIS — R262 Difficulty in walking, not elsewhere classified: Secondary | ICD-10-CM | POA: Diagnosis not present

## 2018-02-12 DIAGNOSIS — R69 Illness, unspecified: Secondary | ICD-10-CM | POA: Diagnosis not present

## 2018-02-12 DIAGNOSIS — I10 Essential (primary) hypertension: Secondary | ICD-10-CM | POA: Diagnosis not present

## 2018-02-14 DIAGNOSIS — Z7982 Long term (current) use of aspirin: Secondary | ICD-10-CM | POA: Diagnosis not present

## 2018-02-14 DIAGNOSIS — R69 Illness, unspecified: Secondary | ICD-10-CM | POA: Diagnosis not present

## 2018-02-14 DIAGNOSIS — R262 Difficulty in walking, not elsewhere classified: Secondary | ICD-10-CM | POA: Diagnosis not present

## 2018-02-14 DIAGNOSIS — I10 Essential (primary) hypertension: Secondary | ICD-10-CM | POA: Diagnosis not present

## 2018-02-19 DIAGNOSIS — Z7982 Long term (current) use of aspirin: Secondary | ICD-10-CM | POA: Diagnosis not present

## 2018-02-19 DIAGNOSIS — R69 Illness, unspecified: Secondary | ICD-10-CM | POA: Diagnosis not present

## 2018-02-19 DIAGNOSIS — I10 Essential (primary) hypertension: Secondary | ICD-10-CM | POA: Diagnosis not present

## 2018-02-19 DIAGNOSIS — R262 Difficulty in walking, not elsewhere classified: Secondary | ICD-10-CM | POA: Diagnosis not present

## 2018-02-21 DIAGNOSIS — R262 Difficulty in walking, not elsewhere classified: Secondary | ICD-10-CM | POA: Diagnosis not present

## 2018-02-21 DIAGNOSIS — I10 Essential (primary) hypertension: Secondary | ICD-10-CM | POA: Diagnosis not present

## 2018-02-21 DIAGNOSIS — Z7982 Long term (current) use of aspirin: Secondary | ICD-10-CM | POA: Diagnosis not present

## 2018-02-21 DIAGNOSIS — R69 Illness, unspecified: Secondary | ICD-10-CM | POA: Diagnosis not present

## 2018-02-22 DIAGNOSIS — R69 Illness, unspecified: Secondary | ICD-10-CM | POA: Diagnosis not present

## 2018-02-22 DIAGNOSIS — F0150 Vascular dementia without behavioral disturbance: Secondary | ICD-10-CM

## 2018-02-22 DIAGNOSIS — R262 Difficulty in walking, not elsewhere classified: Secondary | ICD-10-CM

## 2018-02-22 DIAGNOSIS — Z7982 Long term (current) use of aspirin: Secondary | ICD-10-CM

## 2018-02-22 DIAGNOSIS — I10 Essential (primary) hypertension: Secondary | ICD-10-CM

## 2018-02-22 DIAGNOSIS — F419 Anxiety disorder, unspecified: Secondary | ICD-10-CM

## 2018-02-26 DIAGNOSIS — R262 Difficulty in walking, not elsewhere classified: Secondary | ICD-10-CM | POA: Diagnosis not present

## 2018-02-26 DIAGNOSIS — R69 Illness, unspecified: Secondary | ICD-10-CM | POA: Diagnosis not present

## 2018-02-26 DIAGNOSIS — I10 Essential (primary) hypertension: Secondary | ICD-10-CM | POA: Diagnosis not present

## 2018-02-26 DIAGNOSIS — Z7982 Long term (current) use of aspirin: Secondary | ICD-10-CM | POA: Diagnosis not present

## 2018-03-04 ENCOUNTER — Emergency Department (HOSPITAL_COMMUNITY): Payer: Medicare HMO

## 2018-03-04 ENCOUNTER — Emergency Department (HOSPITAL_COMMUNITY)
Admission: EM | Admit: 2018-03-04 | Discharge: 2018-03-04 | Disposition: A | Discharge status: Skilled Nursing Facility | Payer: Medicare HMO | Attending: Emergency Medicine | Admitting: Emergency Medicine

## 2018-03-04 ENCOUNTER — Other Ambulatory Visit: Payer: Self-pay

## 2018-03-04 ENCOUNTER — Encounter (HOSPITAL_COMMUNITY): Payer: Self-pay

## 2018-03-04 DIAGNOSIS — S060X9A Concussion with loss of consciousness of unspecified duration, initial encounter: Secondary | ICD-10-CM | POA: Diagnosis not present

## 2018-03-04 DIAGNOSIS — S199XXA Unspecified injury of neck, initial encounter: Secondary | ICD-10-CM | POA: Diagnosis not present

## 2018-03-04 DIAGNOSIS — Z853 Personal history of malignant neoplasm of breast: Secondary | ICD-10-CM | POA: Diagnosis not present

## 2018-03-04 DIAGNOSIS — Z7982 Long term (current) use of aspirin: Secondary | ICD-10-CM | POA: Diagnosis not present

## 2018-03-04 DIAGNOSIS — S0993XA Unspecified injury of face, initial encounter: Secondary | ICD-10-CM | POA: Diagnosis not present

## 2018-03-04 DIAGNOSIS — R609 Edema, unspecified: Secondary | ICD-10-CM | POA: Diagnosis not present

## 2018-03-04 DIAGNOSIS — Z23 Encounter for immunization: Secondary | ICD-10-CM | POA: Diagnosis not present

## 2018-03-04 DIAGNOSIS — W06XXXA Fall from bed, initial encounter: Secondary | ICD-10-CM | POA: Insufficient documentation

## 2018-03-04 DIAGNOSIS — Y939 Activity, unspecified: Secondary | ICD-10-CM | POA: Insufficient documentation

## 2018-03-04 DIAGNOSIS — Y929 Unspecified place or not applicable: Secondary | ICD-10-CM | POA: Diagnosis not present

## 2018-03-04 DIAGNOSIS — I10 Essential (primary) hypertension: Secondary | ICD-10-CM | POA: Diagnosis not present

## 2018-03-04 DIAGNOSIS — Z79899 Other long term (current) drug therapy: Secondary | ICD-10-CM | POA: Insufficient documentation

## 2018-03-04 DIAGNOSIS — S0181XA Laceration without foreign body of other part of head, initial encounter: Secondary | ICD-10-CM | POA: Diagnosis not present

## 2018-03-04 DIAGNOSIS — Y999 Unspecified external cause status: Secondary | ICD-10-CM | POA: Insufficient documentation

## 2018-03-04 DIAGNOSIS — S0990XA Unspecified injury of head, initial encounter: Secondary | ICD-10-CM | POA: Diagnosis not present

## 2018-03-04 DIAGNOSIS — W19XXXA Unspecified fall, initial encounter: Secondary | ICD-10-CM | POA: Diagnosis not present

## 2018-03-04 MED ORDER — TETANUS-DIPHTH-ACELL PERTUSSIS 5-2.5-18.5 LF-MCG/0.5 IM SUSP
0.5000 mL | Freq: Once | INTRAMUSCULAR | Status: AC
  Administered 2018-03-04: 0.5 mL via INTRAMUSCULAR
  Filled 2018-03-04: qty 0.5

## 2018-03-04 MED ORDER — LIDOCAINE-EPINEPHRINE (PF) 1 %-1:200000 IJ SOLN
30.0000 mL | Freq: Once | INTRAMUSCULAR | Status: AC
  Administered 2018-03-04: 30 mL

## 2018-03-04 MED ORDER — LIDOCAINE-EPINEPHRINE (PF) 1 %-1:200000 IJ SOLN
INTRAMUSCULAR | Status: AC
  Filled 2018-03-04: qty 30

## 2018-03-04 NOTE — ED Triage Notes (Signed)
EMS got called out for fall. Staff reported to EMS that pt fell out of bed and nit head on nightstand, laceration noted above left eyebrow. Bleeding controlled. Pt has hx of dementia, staff reported to EMSpt is baseline regarding mental status

## 2018-03-04 NOTE — ED Notes (Signed)
Laceration cleaned and steri-strips applied

## 2018-03-04 NOTE — ED Provider Notes (Signed)
[facility] EMERGENCY DEPARTMENT Provider Note   CSN: [id number] Arrival date & time: 03/04/18  0026     Pt seen with NP student, I performed history/physical/documentation   History   Chief Complaint Chief Complaint  Patient presents with  . Fall  . Facial Laceration   Level 5 caveat due to dementia HPI Regina Williams is a 84 y.o. female.  The history is provided by the patient. The history is limited by the condition of the patient.  Fall  This is a new problem. The problem occurs constantly. The problem has not changed since onset.Nothing aggravates the symptoms. Nothing relieves the symptoms.  Patient presents  after fall.  It is reported patient got out of bed and hit her head on the nightstand.  She has a laceration above left eyebrow.  Patient is not on anticoagulants.  She reports pain and swelling over her forehead.  Past Medical History:  Diagnosis Date  . Anxiety   . Breast cancer ([place]) 1994   right  . Cystocele   . Dry eyes   . Hyperlipidemia   . Hypertension   . Internal hemorrhoids   . Liver lesion 2013   multiple  . Osteoporosis     Patient Active Problem List   Diagnosis Date Noted  . Acute confusional state 06/22/2016  . Orthostatic syncope 04/09/2015  . Allergic rhinitis 10/27/2014  . Trochanteric bursitis of right hip 06/27/2014  . Dementia 10/02/2013  . Adjustment disorder with mixed anxiety and depressed mood 01/24/2013  . Hematoma of left thigh 12/27/2012  . Recurrent knee pain 12/26/2012  . Hematochezia 11/05/2012  . Shoulder pain, left 05/18/2012  . Well adult exam 03/22/2011  . Neoplasm of uncertain behavior of skin 09/21/2010  . CHILLS WITHOUT FEVER 09/15/2009  . Pain in limb 09/15/2008  . Peripheral vascular disease ([place]) 11/12/2007  . BRONCHITIS 11/12/2007  . ALLERGY, HX OF 11/12/2007  . Actinic keratosis 09/17/2007  . Dyslipidemia 02/23/2007  . Anxiety state 02/23/2007  . Essential hypertension 02/23/2007  . Asymptomatic  stenosis of left carotid artery 02/23/2007  . Osteoporosis 02/23/2007  . ABNORMAL ELECTROCARDIOGRAM 02/23/2007  . BREAST CANCER, HX OF 02/23/2007    Past Surgical History:  Procedure Laterality Date  . ABDOMINAL HYSTERECTOMY    . CHOLECYSTECTOMY    . MASTECTOMY       OB History   None      Home Medications    Prior to Admission medications   Medication Sig Start Date End Date Taking? Authorizing Provider  aspirin 81 MG tablet Take 1 tablet (81 mg total) by mouth daily. 11/16/17   Plotnikov, Evie Lacks, MD  FLUoxetine (PROZAC) 10 MG tablet Take 0.5 tablets (5 mg total) by mouth daily. 08/01/17   Plotnikov, Evie Lacks, MD  hydrocortisone (ANUSOL-HC) 25 MG suppository Place 1 suppository (25 mg total) rectally at bedtime. Patient taking differently: Place 25 mg rectally at bedtime as needed.  01/04/13   Lafayette Dragon, MD  lisinopril (PRINIVIL,ZESTRIL) 2.5 MG tablet Take 1 tablet (2.5 mg total) by mouth daily. 05/12/15   Plotnikov, Evie Lacks, MD  LORazepam (ATIVAN) 1 MG tablet Take 1 tablet (1 mg total) by mouth 2 (two) times daily as needed. 01/30/18   Plotnikov, Evie Lacks, MD  lovastatin (MEVACOR) 20 MG tablet TAKE ONE TABLET BY MOUTH ONCE DAILY AT BEDTIME 04/17/17   Plotnikov, Evie Lacks, MD  NAMZARIC 28-10 MG CP24 TAKE ONE CAPSULE BY MOUTH ONCE DAILY 07/17/17   Plotnikov, Evie Lacks, MD  Family History Family History  Problem Relation Age of Onset  . Heart disease Mother 57       MI  . Colon cancer Neg Hx     Social History Social History   Tobacco Use  . Smoking status: Never Smoker  . Smokeless tobacco: Never Used  Substance Use Topics  . Alcohol use: No  . Drug use: No     Allergies   Zofran [ondansetron hcl]   Review of Systems Review of Systems  Unable to perform ROS: Dementia     Physical Exam Updated Vital Signs BP (!) 181/63 (BP Location: Left Arm)   Pulse 66   Temp 97.9 F (36.6 C) (Oral)   Resp 20   Wt 44.5 kg   SpO2 100%   BMI 19.16 kg/m    Physical Exam CONSTITUTIONAL: elderly, no acute distress HEAD: small laceration just above left eyebrow EYES: EOMI/PERRL, no proptosis ENMT: Mucous membranes moist, no facial trauma NECK: supple no meningeal signs SPINE/BACK:entire spine nontender, No bruising/crepitance/stepoffs noted to spine CV: S1/S2 noted  LUNGS: Lungs are clear to auscultation bilaterally, no apparent distress ABDOMEN: soft  NEURO: Pt is awake/alert, patient is mildly confused, moves all extremitiesx4.  No facial droop.   EXTREMITIES: pulses normal/equal, full ROM, All other extremities/joints palpated/ranged and nontender SKIN: warm, color normal   ED Treatments / Results  Labs (all labs ordered are listed, but only abnormal results are displayed) Labs Reviewed - No data to display  EKG None  Radiology Ct Head Wo Contrast  Result Date: 03/04/2018 CLINICAL DATA:  Patient fell out of bed hitting nightstand. Laceration above left eyebrow. EXAM: CT HEAD WITHOUT CONTRAST CT CERVICAL SPINE WITHOUT CONTRAST TECHNIQUE: Multidetector CT imaging of the head and cervical spine was performed following the standard protocol without intravenous contrast. Multiplanar CT image reconstructions of the cervical spine were also generated. COMPARISON:  Head CT 06/22/2016 FINDINGS: CT HEAD FINDINGS Brain: Age related involutional changes of the brain with chronic microvascular ischemic disease of periventricular subcortical white matter. No acute large vascular territory infarction, intra-axial mass nor extra-axial fluid collections. Midline fourth ventricle and basal cisterns without effacement. The brainstem and cerebellum are intact with cerebellar atrophy as before. Vascular: No hyperdense vessel sign. Atherosclerosis at the skull base as before. Skull: Osteopenic appearance of the skull base without acute fracture or suspicious osseous lesions. Sinuses/Orbits: Clear paranasal sinuses and mastoids. Intact orbits and globes. Other:  Mild left supraorbital soft tissue swelling. CT CERVICAL SPINE FINDINGS Alignment: Maintained cervical lordosis with minimal anterolisthesis of C3 on C4 and C4 on C5 likely on the basis of degenerative disc and facet arthropathy. Skull base and vertebrae: Intact skull base. No acute cervical spine fracture. Soft tissues and spinal canal: No prevertebral soft tissue swelling. No visible canal hematoma. Disc levels: Moderate marked disc flattening at C5-6 and C6-7. Multilevel degenerative facet arthrosis, right greater than left. Upper chest: Scarring at the apices. Other: Moderate dense atherosclerosis of the extracranial carotid arteries. IMPRESSION: 1. Left supraorbital soft tissue swelling. No underlying skull fracture. 2. Involutional changes of the brain chronic small vessel ischemia, likely age related. No acute intracranial abnormality. 3. No acute cervical spine fracture. Cervical spondylosis degenerative disc disease at C5-6 and C6-7 in particular. Minimal anterolisthesis of C3 on C4 and C4 on C5, grade 1 likely on the basis of degenerative disc and facet arthropathy. 4. Atherosclerosis of the extracranial carotids. Electronically Signed   By: Ashley Royalty M.D.   On: 03/04/2018 02:27   Ct  Cervical Spine Wo Contrast  Result Date: 03/04/2018 CLINICAL DATA:  Patient fell out of bed hitting nightstand. Laceration above left eyebrow. EXAM: CT HEAD WITHOUT CONTRAST CT CERVICAL SPINE WITHOUT CONTRAST TECHNIQUE: Multidetector CT imaging of the head and cervical spine was performed following the standard protocol without intravenous contrast. Multiplanar CT image reconstructions of the cervical spine were also generated. COMPARISON:  Head CT 06/22/2016 FINDINGS: CT HEAD FINDINGS Brain: Age related involutional changes of the brain with chronic microvascular ischemic disease of periventricular subcortical white matter. No acute large vascular territory infarction, intra-axial mass nor extra-axial fluid collections.  Midline fourth ventricle and basal cisterns without effacement. The brainstem and cerebellum are intact with cerebellar atrophy as before. Vascular: No hyperdense vessel sign. Atherosclerosis at the skull base as before. Skull: Osteopenic appearance of the skull base without acute fracture or suspicious osseous lesions. Sinuses/Orbits: Clear paranasal sinuses and mastoids. Intact orbits and globes. Other: Mild left supraorbital soft tissue swelling. CT CERVICAL SPINE FINDINGS Alignment: Maintained cervical lordosis with minimal anterolisthesis of C3 on C4 and C4 on C5 likely on the basis of degenerative disc and facet arthropathy. Skull base and vertebrae: Intact skull base. No acute cervical spine fracture. Soft tissues and spinal canal: No prevertebral soft tissue swelling. No visible canal hematoma. Disc levels: Moderate marked disc flattening at C5-6 and C6-7. Multilevel degenerative facet arthrosis, right greater than left. Upper chest: Scarring at the apices. Other: Moderate dense atherosclerosis of the extracranial carotid arteries. IMPRESSION: 1. Left supraorbital soft tissue swelling. No underlying skull fracture. 2. Involutional changes of the brain chronic small vessel ischemia, likely age related. No acute intracranial abnormality. 3. No acute cervical spine fracture. Cervical spondylosis degenerative disc disease at C5-6 and C6-7 in particular. Minimal anterolisthesis of C3 on C4 and C4 on C5, grade 1 likely on the basis of degenerative disc and facet arthropathy. 4. Atherosclerosis of the extracranial carotids. Electronically Signed   By: Ashley Royalty M.D.   On: 03/04/2018 02:27    Procedures Procedures Medications Ordered in ED Medications  Tdap (BOOSTRIX) injection 0.5 mL (0.5 mLs Intramuscular Given 03/04/18 0221)     Initial Impression / Assessment and Plan / ED Course  I have reviewed the triage vital signs and the nursing notes.  Pertinent  imaging results that were available during  my care of the patient were reviewed by me and considered in my medical decision making (see chart for details).     1:29 AM Patient presents to nursing facility after mechanical fall.  She is awake and alert and her baseline.  Will obtain CT head to rule out ICH, will obtain CT C-spine as I cannot comfortably rule out spinal injury 2:55 AM CT imaging is negative. Wound has been repaired by nursing with Steri-Strips. She is awake alert in no distress.  Patient will be discharged with family  Final Clinical Impressions(s) / ED Diagnoses   Final diagnoses:  Laceration of forehead, initial encounter  Concussion with loss of consciousness, initial encounter    ED Discharge Orders    None       Ripley Fraise, MD 03/04/18 [phone]

## 2018-03-04 NOTE — ED Provider Notes (Signed)
Just prior to discharge when patient set up her wound started bleeding.  The Steri-Strips were not holding the wound.  I was able to place 2 sutures without difficulty  LACERATION REPAIR Performed by: Sharyon Cable Consent: Verbal consent obtained. Risks and benefits: risks, benefits and alternatives were discussed Patient identity confirmed: provided demographic data Time out performed prior to procedure Prepped and Draped in normal sterile fashion Wound explored Laceration Location: left forehead Laceration Length: 1cm No Foreign Bodies seen or palpated Anesthesia: local infiltration Local anesthetic: lidocaine % with epinephrine Anesthetic total: 3 ml Irrigation method: syringe Amount of cleaning: standard Skin closure: simple interrupted Number of sutures or staples: 2 vicryl Technique: simple interrupted Patient tolerance: Patient tolerated the procedure well with no immediate complications.    Ripley Fraise, MD 03/04/18 [phone]

## 2018-03-04 NOTE — ED Notes (Signed)
Called to room by family stating laceration bleeding again, laceration bleeding thru steri-strips; EDP called to bedside, 2 dissolvable stitches placed above left eyebrow, updated report called to [facility]

## 2018-03-06 ENCOUNTER — Emergency Department (HOSPITAL_COMMUNITY): Payer: Medicare HMO

## 2018-03-06 ENCOUNTER — Emergency Department (HOSPITAL_COMMUNITY)
Admission: EM | Admit: 2018-03-06 | Discharge: 2018-03-06 | Disposition: A | Discharge status: Home / Self Care | Payer: Medicare HMO | Attending: Emergency Medicine | Admitting: Emergency Medicine

## 2018-03-06 ENCOUNTER — Encounter (HOSPITAL_COMMUNITY): Payer: Self-pay | Admitting: Emergency Medicine

## 2018-03-06 ENCOUNTER — Telehealth: Payer: Self-pay | Admitting: Internal Medicine

## 2018-03-06 ENCOUNTER — Other Ambulatory Visit: Payer: Self-pay

## 2018-03-06 DIAGNOSIS — R51 Headache: Secondary | ICD-10-CM | POA: Diagnosis not present

## 2018-03-06 DIAGNOSIS — Z853 Personal history of malignant neoplasm of breast: Secondary | ICD-10-CM | POA: Insufficient documentation

## 2018-03-06 DIAGNOSIS — F4323 Adjustment disorder with mixed anxiety and depressed mood: Secondary | ICD-10-CM | POA: Insufficient documentation

## 2018-03-06 DIAGNOSIS — R69 Illness, unspecified: Secondary | ICD-10-CM | POA: Diagnosis not present

## 2018-03-06 DIAGNOSIS — Z9049 Acquired absence of other specified parts of digestive tract: Secondary | ICD-10-CM | POA: Diagnosis not present

## 2018-03-06 DIAGNOSIS — R112 Nausea with vomiting, unspecified: Secondary | ICD-10-CM | POA: Diagnosis not present

## 2018-03-06 DIAGNOSIS — I959 Hypotension, unspecified: Secondary | ICD-10-CM | POA: Diagnosis not present

## 2018-03-06 DIAGNOSIS — F419 Anxiety disorder, unspecified: Secondary | ICD-10-CM | POA: Insufficient documentation

## 2018-03-06 DIAGNOSIS — F0390 Unspecified dementia without behavioral disturbance: Secondary | ICD-10-CM | POA: Diagnosis not present

## 2018-03-06 DIAGNOSIS — R410 Disorientation, unspecified: Secondary | ICD-10-CM | POA: Diagnosis not present

## 2018-03-06 DIAGNOSIS — F0781 Postconcussional syndrome: Secondary | ICD-10-CM | POA: Insufficient documentation

## 2018-03-06 DIAGNOSIS — Z79899 Other long term (current) drug therapy: Secondary | ICD-10-CM | POA: Insufficient documentation

## 2018-03-06 DIAGNOSIS — Z7982 Long term (current) use of aspirin: Secondary | ICD-10-CM | POA: Insufficient documentation

## 2018-03-06 DIAGNOSIS — G4489 Other headache syndrome: Secondary | ICD-10-CM | POA: Diagnosis not present

## 2018-03-06 DIAGNOSIS — I10 Essential (primary) hypertension: Secondary | ICD-10-CM | POA: Insufficient documentation

## 2018-03-06 MED ORDER — PROMETHAZINE HCL 25 MG PO TABS: 12.5000 mg | ORAL_TABLET | Freq: Four times a day (QID) | ORAL | 0 refills | Status: DC | PRN

## 2018-03-06 NOTE — Telephone Encounter (Signed)
Please advise 

## 2018-03-06 NOTE — Telephone Encounter (Signed)
Noted Pls d/c phenergan Thx

## 2018-03-06 NOTE — ED Provider Notes (Signed)
[facility] EMERGENCY DEPARTMENT Provider Note   CSN: [id number] Arrival date & time: 03/06/18  1244     History   Chief Complaint Chief Complaint  Patient presents with  . Nausea  . Headache    HPI Regina Williams is a 84 y.o. female.  HPI Patient presented to the emergency room for evaluation of headache, nausea and vomiting today.  Patient has a history of dementia and the history is primarily provided by the EMS report.  Patient herself is not sure why she is here and denies any complaints right now.  Patient fell a couple of days ago and was seen in the emergency room in the early morning of August 11.  Patient was noted to have a laceration of her forehead.  That wound was repaired in the ED.  While she was in the ED she also had a head CT of her head and neck.  No acute findings were noted.  According to the nursing notes the patient vomited twice today.  She was complaining of pain in her head and her neck.  Again, right now the patient denies any specific complaints and she does not know why she is here in the emergency room. Past Medical History:  Diagnosis Date  . Anxiety   . Breast cancer ([place]) 1994   right  . Cystocele   . Dry eyes   . Hyperlipidemia   . Hypertension   . Internal hemorrhoids   . Liver lesion 2013   multiple  . Osteoporosis     Patient Active Problem List   Diagnosis Date Noted  . Acute confusional state 06/22/2016  . Orthostatic syncope 04/09/2015  . Allergic rhinitis 10/27/2014  . Trochanteric bursitis of right hip 06/27/2014  . Dementia 10/02/2013  . Adjustment disorder with mixed anxiety and depressed mood 01/24/2013  . Hematoma of left thigh 12/27/2012  . Recurrent knee pain 12/26/2012  . Hematochezia 11/05/2012  . Shoulder pain, left 05/18/2012  . Well adult exam 03/22/2011  . Neoplasm of uncertain behavior of skin 09/21/2010  . CHILLS WITHOUT FEVER 09/15/2009  . Pain in limb 09/15/2008  . Peripheral vascular disease ([place])  11/12/2007  . BRONCHITIS 11/12/2007  . ALLERGY, HX OF 11/12/2007  . Actinic keratosis 09/17/2007  . Dyslipidemia 02/23/2007  . Anxiety state 02/23/2007  . Essential hypertension 02/23/2007  . Asymptomatic stenosis of left carotid artery 02/23/2007  . Osteoporosis 02/23/2007  . ABNORMAL ELECTROCARDIOGRAM 02/23/2007  . BREAST CANCER, HX OF 02/23/2007    Past Surgical History:  Procedure Laterality Date  . ABDOMINAL HYSTERECTOMY    . CHOLECYSTECTOMY    . MASTECTOMY       OB History   None      Home Medications    Prior to Admission medications   Medication Sig Start Date End Date Taking? Authorizing Provider  aspirin 81 MG tablet Take 1 tablet (81 mg total) by mouth daily. 11/16/17   Plotnikov, Evie Lacks, MD  FLUoxetine (PROZAC) 10 MG tablet Take 0.5 tablets (5 mg total) by mouth daily. 08/01/17   Plotnikov, Evie Lacks, MD  hydrocortisone (ANUSOL-HC) 25 MG suppository Place 1 suppository (25 mg total) rectally at bedtime. Patient taking differently: Place 25 mg rectally at bedtime as needed.  01/04/13   Lafayette Dragon, MD  lisinopril (PRINIVIL,ZESTRIL) 2.5 MG tablet Take 1 tablet (2.5 mg total) by mouth daily. 05/12/15   Plotnikov, Evie Lacks, MD  LORazepam (ATIVAN) 1 MG tablet Take 1 tablet (1 mg total) by mouth 2 (  two) times daily as needed. 01/30/18   Plotnikov, Evie Lacks, MD  lovastatin (MEVACOR) 20 MG tablet TAKE ONE TABLET BY MOUTH ONCE DAILY AT BEDTIME 04/17/17   Plotnikov, Evie Lacks, MD  NAMZARIC 28-10 MG CP24 TAKE ONE CAPSULE BY MOUTH ONCE DAILY 07/17/17   Plotnikov, Evie Lacks, MD  promethazine (PHENERGAN) 25 MG tablet Take 0.5 tablets (12.5 mg total) by mouth every 6 (six) hours as needed for nausea or vomiting. 03/06/18   Dorie Rank, MD    Family History Family History  Problem Relation Age of Onset  . Heart disease Mother 36       MI  . Colon cancer Neg Hx     Social History Social History   Tobacco Use  . Smoking status: Never Smoker  . Smokeless tobacco: Never  Used  Substance Use Topics  . Alcohol use: No  . Drug use: No     Allergies   Zofran [ondansetron hcl]   Review of Systems Review of Systems  All other systems reviewed and are negative.    Physical Exam Updated Vital Signs BP (!) 130/47 (BP Location: Right Arm)   Pulse (!) 55   Temp (!) 97.3 F (36.3 C) (Oral)   Resp (!) 22   Wt 44.5 kg   SpO2 97%   BMI 19.14 kg/m   Physical Exam  Constitutional: No distress.  Elderly, frail  HENT:  Head: Normocephalic.  Right Ear: External ear normal.  Left Ear: External ear normal.  Bruising noted around the forehead and left eye  Eyes: Conjunctivae are normal. Right eye exhibits no discharge. Left eye exhibits no discharge. No scleral icterus.  Neck: Neck supple. No tracheal deviation present.  No spinal tenderness to palpation  Cardiovascular: Normal rate, regular rhythm and intact distal pulses.  Pulmonary/Chest: Effort normal and breath sounds normal. No stridor. No respiratory distress. She has no wheezes. She has no rales.  Abdominal: Soft. Bowel sounds are normal. She exhibits no distension. There is no tenderness. There is no rebound and no guarding.  Musculoskeletal: She exhibits no edema or tenderness.  Neurological: She is alert. She has normal strength. No cranial nerve deficit (no facial droop, extraocular movements intact, no slurred speech) or sensory deficit. She exhibits normal muscle tone. She displays no seizure activity. Coordination normal.  Skin: Skin is warm and dry. No rash noted.  Psychiatric: She has a normal mood and affect.  Nursing note and vitals reviewed.    ED Treatments / Results  Labs (all labs ordered are listed, but only abnormal results are displayed) Labs Reviewed - No data to display  EKG None  Radiology Ct Head Wo Contrast  Result Date: 03/06/2018 CLINICAL DATA:  Recent fall.  Worsening headache and vomiting. EXAM: CT HEAD WITHOUT CONTRAST TECHNIQUE: Contiguous axial images were  obtained from the base of the skull through the vertex without intravenous contrast. COMPARISON:  03/04/2018 head CT. FINDINGS: Brain: No evidence of parenchymal hemorrhage or extra-axial fluid collection. No mass lesion, mass effect, or midline shift. No CT evidence of acute infarction. Generalized cerebral volume loss. Nonspecific mild subcortical and periventricular white matter hypodensity, most in keeping with chronic small vessel ischemic change. No ventriculomegaly. Vascular: No acute abnormality. Skull: No evidence of calvarial fracture. Sinuses/Orbits: No fluid levels. Mild frothy material layering in left maxillary sinus. Other: Tiny lateral left supraorbital scalp contusion, slightly decreased. The mastoid air cells are unopacified. IMPRESSION: 1. Tiny left supraorbital scalp contusion, decreased. No evidence of calvarial fracture. 2.  No  evidence of acute intracranial abnormality. 3. Generalized cerebral volume loss and mild chronic small vessel ischemic changes in the cerebral white matter. Electronically Signed   By: Ilona Sorrel M.D.   On: 03/06/2018 13:27    Procedures Procedures (including critical care time)  Medications Ordered in ED Medications - No data to display   Initial Impression / Assessment and Plan / ED Course  I have reviewed the triage vital signs and the nursing notes.  Pertinent labs & imaging results that were available during my care of the patient were reviewed by me and considered in my medical decision making (see chart for details).  Clinical Course as of Mar 06 1333  Tue Mar 06, 2018  1307 Reviewed the CT scan reports from 2 days ago.   [JK]    Clinical Course User Index [JK] Dorie Rank, MD    Patient presents from the nursing facility after complaining of headache today associated with some nausea and vomiting after recent head injury.  Patient appears alert and comfortable here in the emergency room.  CT scan does not show any evidence of a developing  subdural hematoma since her recent CT scan.  I suspect she is having some postconcussive syndrome symptoms.  Patient can take over-the-counter medications as needed.  Patient has a Zofran allergy according to the medical records so I will give her a prescription for Phenergan to take for nausea and vomiting.  Final Clinical Impressions(s) / ED Diagnoses   Final diagnoses:  Post concussion syndrome    ED Discharge Orders         Ordered    promethazine (PHENERGAN) 25 MG tablet  Every 6 hours PRN     03/06/18 1333           Dorie Rank, MD 03/06/18 1335

## 2018-03-06 NOTE — Discharge Instructions (Addendum)
Take over the counter medications as needed for pain, you can take 1/2 tablet of phenergan as needed for nausea and vomiting

## 2018-03-06 NOTE — ED Notes (Signed)
Pt is back from x-ray

## 2018-03-06 NOTE — Telephone Encounter (Signed)
Copied from [place] [id number]. Topic: General - Other >> Mar 06, 2018  2:25 PM Keene Breath wrote: Reason for CRM: [name] at [facility] called to inform doctor that patient had fell last Saturday, went to the ER and was told she had a concussion.  The ER prescribed Phenergan.  [name] wanted to know if this is something that the patient should take since it makes her sleepy and she is already a fall risk.  Please advise.  CB# [id number].

## 2018-03-06 NOTE — ED Triage Notes (Signed)
Pt fell last Saturday, received sutures in her left eye.  Pt woke up this morning, per staff from [facility], pt started c/o of headache, dizziness, neck pain and n/v.  pt vomited twice today.

## 2018-03-07 NOTE — Telephone Encounter (Signed)
Faxed copy of msg to [phone].Marland KitchenJohny Chess

## 2018-03-07 NOTE — Telephone Encounter (Signed)
Notified [name] w/MD response. Ask [name] if pt c/o of being nauseated and she stated no pt has not complained of any nausea sxs, but if she does will call back to get order from MD../lmb

## 2018-03-07 NOTE — Telephone Encounter (Signed)
[name] with [facility] called back.  She states that they need the doctor to send over an order to discontinue the medication, they can not use a verbal.  Fax number is [phone].

## 2018-04-16 DIAGNOSIS — R69 Illness, unspecified: Secondary | ICD-10-CM | POA: Diagnosis not present

## 2018-05-30 ENCOUNTER — Ambulatory Visit (INDEPENDENT_AMBULATORY_CARE_PROVIDER_SITE_OTHER): Payer: Medicare HMO | Admitting: Internal Medicine

## 2018-05-30 ENCOUNTER — Other Ambulatory Visit (INDEPENDENT_AMBULATORY_CARE_PROVIDER_SITE_OTHER): Payer: Medicare HMO

## 2018-05-30 ENCOUNTER — Encounter: Payer: Self-pay | Admitting: Internal Medicine

## 2018-05-30 DIAGNOSIS — F411 Generalized anxiety disorder: Secondary | ICD-10-CM

## 2018-05-30 DIAGNOSIS — F01518 Vascular dementia, unspecified severity, with other behavioral disturbance: Secondary | ICD-10-CM

## 2018-05-30 DIAGNOSIS — E785 Hyperlipidemia, unspecified: Secondary | ICD-10-CM

## 2018-05-30 DIAGNOSIS — R69 Illness, unspecified: Secondary | ICD-10-CM | POA: Diagnosis not present

## 2018-05-30 DIAGNOSIS — I10 Essential (primary) hypertension: Secondary | ICD-10-CM | POA: Diagnosis not present

## 2018-05-30 DIAGNOSIS — F0151 Vascular dementia with behavioral disturbance: Secondary | ICD-10-CM | POA: Diagnosis not present

## 2018-05-30 LAB — BASIC METABOLIC PANEL
BUN: 35 mg/dL — ABNORMAL HIGH (ref 6–23)
CALCIUM: 9.2 mg/dL (ref 8.4–10.5)
CO2: 30 mEq/L (ref 19–32)
CREATININE: 1 mg/dL (ref 0.40–1.20)
Chloride: 104 mEq/L (ref 96–112)
GFR: 55.24 mL/min — AB (ref >60.00)
Glucose, Bld: 97 mg/dL (ref 70–99)
Potassium: 4.3 mEq/L (ref 3.5–5.1)
Sodium: 140 mEq/L (ref 135–145)

## 2018-05-30 LAB — TSH: TSH: 1.75 u[IU]/mL (ref 0.35–4.50)

## 2018-05-30 NOTE — Assessment & Plan Note (Signed)
Lisinopril 

## 2018-05-30 NOTE — Assessment & Plan Note (Signed)
Planning to move to assisted living - [facility] in [place] Doing well

## 2018-05-30 NOTE — Progress Notes (Signed)
Subjective:  Patient ID: Regina Williams, female    DOB: 06-24-1925  Age: 84 y.o. MRN: [id number]  CC: No chief complaint on file.   HPI Regina Williams presents for anxiety, dementia, HTN f/u Living at [facility]   Outpatient Medications Prior to Visit  Medication Sig Dispense Refill  . aspirin 81 MG tablet Take 1 tablet (81 mg total) by mouth daily. 30 tablet 11  . FLUoxetine (PROZAC) 10 MG tablet Take 0.5 tablets (5 mg total) by mouth daily. 45 tablet 3  . hydrocortisone (ANUSOL-HC) 25 MG suppository Place 1 suppository (25 mg total) rectally at bedtime. (Patient taking differently: Place 25 mg rectally at bedtime as needed. ) 12 suppository 0  . lisinopril (PRINIVIL,ZESTRIL) 2.5 MG tablet Take 1 tablet (2.5 mg total) by mouth daily. 90 tablet 3  . LORazepam (ATIVAN) 1 MG tablet Take 1 tablet (1 mg total) by mouth 2 (two) times daily as needed. 60 tablet 3  . lovastatin (MEVACOR) 20 MG tablet TAKE ONE TABLET BY MOUTH ONCE DAILY AT BEDTIME 90 tablet 3  . NAMZARIC 28-10 MG CP24 TAKE ONE CAPSULE BY MOUTH ONCE DAILY 30 capsule 11   Facility-Administered Medications Prior to Visit  Medication Dose Route Frequency Provider Last Rate Last Dose  . methylPREDNISolone acetate (DEPO-MEDROL) injection 40 mg  40 mg Intra-articular Once Anant Agard, Evie Lacks, MD        ROS: Review of Systems  Constitutional: Negative for activity change, appetite change, chills, fatigue and unexpected weight change.  HENT: Negative for congestion, mouth sores and sinus pressure.   Eyes: Negative for visual disturbance.  Respiratory: Negative for cough and chest tightness.   Gastrointestinal: Negative for abdominal pain and nausea.  Genitourinary: Negative for difficulty urinating, frequency and vaginal pain.  Musculoskeletal: Negative for back pain and gait problem.  Skin: Negative for pallor and rash.  Neurological: Negative for dizziness, tremors, weakness, numbness and headaches.    Psychiatric/Behavioral: Positive for confusion and decreased concentration. Negative for sleep disturbance and suicidal ideas. The patient is nervous/anxious.     Objective:  There were no vitals taken for this visit.  BP Readings from Last 3 Encounters:  03/06/18 (!) 104/40  03/04/18 (!) 139/52  01/30/18 122/68    Wt Readings from Last 3 Encounters:  03/06/18 98 lb (44.5 kg)  03/04/18 98 lb 1.7 oz (44.5 kg)  01/30/18 98 lb (44.5 kg)    Physical Exam  Constitutional: She appears well-developed. No distress.  HENT:  Head: Normocephalic.  Right Ear: External ear normal.  Left Ear: External ear normal.  Nose: Nose normal.  Mouth/Throat: Oropharynx is clear and moist.  Eyes: Pupils are equal, round, and reactive to light. Conjunctivae are normal. Right eye exhibits no discharge. Left eye exhibits no discharge.  Neck: Normal range of motion. Neck supple. No JVD present. No tracheal deviation present. No thyromegaly present.  Cardiovascular: Normal rate, regular rhythm and normal heart sounds.  Pulmonary/Chest: No stridor. No respiratory distress. She has no wheezes.  Abdominal: Soft. Bowel sounds are normal. She exhibits no distension and no mass. There is no tenderness. There is no rebound and no guarding.  Musculoskeletal: She exhibits no edema or tenderness.  Lymphadenopathy:    She has no cervical adenopathy.  Neurological: She displays normal reflexes. No cranial nerve deficit. She exhibits normal muscle tone. Coordination abnormal.  Skin: No rash noted. No erythema.  Psychiatric: She has a normal mood and affect. Her behavior is normal.  disoriented, alert, cooperative Head  NT  Lab Results  Component Value Date   WBC 6.9 06/22/2016   HGB 13.3 06/22/2016   HCT 38.6 06/22/2016   PLT 224.0 06/22/2016   GLUCOSE 102 (H) 10/16/2017   CHOL 189 07/09/2013   TRIG 78.0 07/09/2013   HDL 81.80 07/09/2013   LDLCALC 92 07/09/2013   ALT 27 10/16/2017   AST 21 10/16/2017   NA  141 10/16/2017   K 4.1 10/16/2017   CL 102 10/16/2017   CREATININE 0.62 10/16/2017   BUN 23 10/16/2017   CO2 33 (H) 10/16/2017   TSH 1.90 10/16/2017    Ct Head Wo Contrast  Result Date: 03/06/2018 CLINICAL DATA:  Recent fall.  Worsening headache and vomiting. EXAM: CT HEAD WITHOUT CONTRAST TECHNIQUE: Contiguous axial images were obtained from the base of the skull through the vertex without intravenous contrast. COMPARISON:  03/04/2018 head CT. FINDINGS: Brain: No evidence of parenchymal hemorrhage or extra-axial fluid collection. No mass lesion, mass effect, or midline shift. No CT evidence of acute infarction. Generalized cerebral volume loss. Nonspecific mild subcortical and periventricular white matter hypodensity, most in keeping with chronic small vessel ischemic change. No ventriculomegaly. Vascular: No acute abnormality. Skull: No evidence of calvarial fracture. Sinuses/Orbits: No fluid levels. Mild frothy material layering in left maxillary sinus. Other: Tiny lateral left supraorbital scalp contusion, slightly decreased. The mastoid air cells are unopacified. IMPRESSION: 1. Tiny left supraorbital scalp contusion, decreased. No evidence of calvarial fracture. 2.  No evidence of acute intracranial abnormality. 3. Generalized cerebral volume loss and mild chronic small vessel ischemic changes in the cerebral white matter. Electronically Signed   By: Ilona Sorrel M.D.   On: 03/06/2018 13:27    Assessment & Plan:   There are no diagnoses linked to this encounter.   No orders of the defined types were placed in this encounter.    Follow-up: No follow-ups on file.  Walker Kehr, MD

## 2018-05-30 NOTE — Assessment & Plan Note (Signed)
Lovastatin 

## 2018-05-30 NOTE — Assessment & Plan Note (Signed)
On Lorazepam prn Prozac qd

## 2018-07-11 DIAGNOSIS — F064 Anxiety disorder due to known physiological condition: Secondary | ICD-10-CM | POA: Diagnosis not present

## 2018-07-11 DIAGNOSIS — F0632 Mood disorder due to known physiological condition with major depressive-like episode: Secondary | ICD-10-CM | POA: Diagnosis not present

## 2018-07-11 DIAGNOSIS — R69 Illness, unspecified: Secondary | ICD-10-CM | POA: Diagnosis not present

## 2018-07-25 DIAGNOSIS — F0632 Mood disorder due to known physiological condition with major depressive-like episode: Secondary | ICD-10-CM | POA: Diagnosis not present

## 2018-07-25 DIAGNOSIS — R69 Illness, unspecified: Secondary | ICD-10-CM | POA: Diagnosis not present

## 2018-07-25 DIAGNOSIS — F064 Anxiety disorder due to known physiological condition: Secondary | ICD-10-CM | POA: Diagnosis not present

## 2018-08-08 DIAGNOSIS — F0632 Mood disorder due to known physiological condition with major depressive-like episode: Secondary | ICD-10-CM | POA: Diagnosis not present

## 2018-08-08 DIAGNOSIS — F064 Anxiety disorder due to known physiological condition: Secondary | ICD-10-CM | POA: Diagnosis not present

## 2018-08-08 DIAGNOSIS — R69 Illness, unspecified: Secondary | ICD-10-CM | POA: Diagnosis not present

## 2018-08-15 DIAGNOSIS — R69 Illness, unspecified: Secondary | ICD-10-CM | POA: Diagnosis not present

## 2018-08-15 DIAGNOSIS — F064 Anxiety disorder due to known physiological condition: Secondary | ICD-10-CM | POA: Diagnosis not present

## 2018-08-15 DIAGNOSIS — F0632 Mood disorder due to known physiological condition with major depressive-like episode: Secondary | ICD-10-CM | POA: Diagnosis not present

## 2018-08-24 ENCOUNTER — Other Ambulatory Visit: Payer: Self-pay

## 2018-08-24 MED ORDER — LORAZEPAM 1 MG PO TABS: 1.0000 mg | ORAL_TABLET | Freq: Two times a day (BID) | ORAL | 3 refills | Status: AC | PRN

## 2018-08-29 DIAGNOSIS — F064 Anxiety disorder due to known physiological condition: Secondary | ICD-10-CM | POA: Diagnosis not present

## 2018-08-29 DIAGNOSIS — F0632 Mood disorder due to known physiological condition with major depressive-like episode: Secondary | ICD-10-CM | POA: Diagnosis not present

## 2018-08-29 DIAGNOSIS — R69 Illness, unspecified: Secondary | ICD-10-CM | POA: Diagnosis not present

## 2018-09-03 ENCOUNTER — Other Ambulatory Visit (INDEPENDENT_AMBULATORY_CARE_PROVIDER_SITE_OTHER): Payer: Medicare HMO

## 2018-09-03 ENCOUNTER — Encounter: Payer: Self-pay | Admitting: Internal Medicine

## 2018-09-03 ENCOUNTER — Ambulatory Visit (INDEPENDENT_AMBULATORY_CARE_PROVIDER_SITE_OTHER): Payer: Medicare HMO | Admitting: Internal Medicine

## 2018-09-03 VITALS — BP 122/64 | HR 67 | Temp 97.6°F | Ht 60.0 in | Wt 109.0 lb

## 2018-09-03 DIAGNOSIS — F4323 Adjustment disorder with mixed anxiety and depressed mood: Secondary | ICD-10-CM

## 2018-09-03 DIAGNOSIS — F411 Generalized anxiety disorder: Secondary | ICD-10-CM

## 2018-09-03 DIAGNOSIS — F01518 Vascular dementia, unspecified severity, with other behavioral disturbance: Secondary | ICD-10-CM

## 2018-09-03 DIAGNOSIS — F0151 Vascular dementia with behavioral disturbance: Secondary | ICD-10-CM | POA: Diagnosis not present

## 2018-09-03 DIAGNOSIS — I10 Essential (primary) hypertension: Secondary | ICD-10-CM

## 2018-09-03 DIAGNOSIS — R69 Illness, unspecified: Secondary | ICD-10-CM | POA: Diagnosis not present

## 2018-09-03 LAB — BASIC METABOLIC PANEL
BUN: 35 mg/dL — ABNORMAL HIGH (ref 6–23)
CALCIUM: 9.1 mg/dL (ref 8.4–10.5)
CHLORIDE: 103 meq/L (ref 96–112)
CO2: 30 meq/L (ref 19–32)
Creatinine, Ser: 0.82 mg/dL (ref 0.40–1.20)
GFR: 65.31 mL/min (ref >60.00)
Glucose, Bld: 96 mg/dL (ref 70–99)
POTASSIUM: 4.3 meq/L (ref 3.5–5.1)
SODIUM: 139 meq/L (ref 135–145)

## 2018-09-03 NOTE — Assessment & Plan Note (Signed)
No change Namzaric

## 2018-09-03 NOTE — Assessment & Plan Note (Signed)
Lisinopril 

## 2018-09-03 NOTE — Assessment & Plan Note (Signed)
Prozac

## 2018-09-03 NOTE — Progress Notes (Signed)
Subjective:  Patient ID: Regina Williams, female    DOB: July 27, 1924  Age: 85 y.o. MRN: [id number]  CC: No chief complaint on file.   HPI SHIGEKO MANARD presents for HTN, dementia, depression f/u  Outpatient Medications Prior to Visit  Medication Sig Dispense Refill  . aspirin 81 MG tablet Take 1 tablet (81 mg total) by mouth daily. 30 tablet 11  . FLUoxetine (PROZAC) 10 MG tablet Take 0.5 tablets (5 mg total) by mouth daily. 45 tablet 3  . hydrocortisone (ANUSOL-HC) 25 MG suppository Place 1 suppository (25 mg total) rectally at bedtime. (Patient taking differently: Place 25 mg rectally at bedtime as needed. ) 12 suppository 0  . lisinopril (PRINIVIL,ZESTRIL) 2.5 MG tablet Take 1 tablet (2.5 mg total) by mouth daily. 90 tablet 3  . LORazepam (ATIVAN) 1 MG tablet Take 1 tablet (1 mg total) by mouth 2 (two) times daily as needed. 60 tablet 3  . lovastatin (MEVACOR) 20 MG tablet TAKE ONE TABLET BY MOUTH ONCE DAILY AT BEDTIME 90 tablet 3  . NAMZARIC 28-10 MG CP24 TAKE ONE CAPSULE BY MOUTH ONCE DAILY 30 capsule 11   Facility-Administered Medications Prior to Visit  Medication Dose Route Frequency Provider Last Rate Last Dose  . methylPREDNISolone acetate (DEPO-MEDROL) injection 40 mg  40 mg Intra-articular Once Plotnikov, Evie Lacks, MD        ROS: Review of Systems  Constitutional: Negative for activity change, appetite change, chills, fatigue and unexpected weight change.  HENT: Negative for congestion, mouth sores and sinus pressure.   Eyes: Negative for visual disturbance.  Respiratory: Negative for cough and chest tightness.   Gastrointestinal: Negative for abdominal pain and nausea.  Genitourinary: Negative for difficulty urinating, frequency and vaginal pain.  Musculoskeletal: Negative for back pain and gait problem.  Skin: Negative for pallor and rash.  Neurological: Negative for dizziness, tremors, weakness, numbness and headaches.  Psychiatric/Behavioral: Positive for confusion  and decreased concentration. Negative for sleep disturbance and suicidal ideas.    Objective:  BP 122/64 (BP Location: Left Arm, Patient Position: Sitting, Cuff Size: Normal)   Pulse 67   Temp 97.6 F (36.4 C) (Oral)   Ht 5' (1.524 m)   Wt 109 lb (49.4 kg)   SpO2 97%   BMI 21.29 kg/m   BP Readings from Last 3 Encounters:  09/03/18 122/64  05/30/18 118/72  03/06/18 (!) 104/40    Wt Readings from Last 3 Encounters:  09/03/18 109 lb (49.4 kg)  05/30/18 105 lb (47.6 kg)  03/06/18 98 lb (44.5 kg)    Physical Exam Constitutional:      General: She is not in acute distress.    Appearance: She is well-developed.  HENT:     Head: Normocephalic.     Right Ear: External ear normal.     Left Ear: External ear normal.     Nose: Nose normal.  Eyes:     General:        Right eye: No discharge.        Left eye: No discharge.     Conjunctiva/sclera: Conjunctivae normal.     Pupils: Pupils are equal, round, and reactive to light.  Neck:     Musculoskeletal: Normal range of motion and neck supple.     Thyroid: No thyromegaly.     Vascular: No JVD.     Trachea: No tracheal deviation.  Cardiovascular:     Rate and Rhythm: Normal rate and regular rhythm.     Heart sounds:  Normal heart sounds.  Pulmonary:     Effort: No respiratory distress.     Breath sounds: No stridor. No wheezing.  Abdominal:     General: Bowel sounds are normal. There is no distension.     Palpations: Abdomen is soft. There is no mass.     Tenderness: There is no abdominal tenderness. There is no guarding or rebound.  Musculoskeletal:        General: No tenderness.  Lymphadenopathy:     Cervical: No cervical adenopathy.  Skin:    Findings: No erythema or rash.  Neurological:     Cranial Nerves: No cranial nerve deficit.     Motor: No abnormal muscle tone.     Coordination: Coordination normal.     Gait: Gait abnormal.     Deep Tendon Reflexes: Reflexes normal.  Psychiatric:        Mood and Affect:  Mood normal.        Behavior: Behavior normal.   confused  Lab Results  Component Value Date   WBC 6.9 06/22/2016   HGB 13.3 06/22/2016   HCT 38.6 06/22/2016   PLT 224.0 06/22/2016   GLUCOSE 97 05/30/2018   CHOL 189 07/09/2013   TRIG 78.0 07/09/2013   HDL 81.80 07/09/2013   LDLCALC 92 07/09/2013   ALT 27 10/16/2017   AST 21 10/16/2017   NA 140 05/30/2018   K 4.3 05/30/2018   CL 104 05/30/2018   CREATININE 1.00 05/30/2018   BUN 35 (H) 05/30/2018   CO2 30 05/30/2018   TSH 1.75 05/30/2018    Ct Head Wo Contrast  Result Date: 03/06/2018 CLINICAL DATA:  Recent fall.  Worsening headache and vomiting. EXAM: CT HEAD WITHOUT CONTRAST TECHNIQUE: Contiguous axial images were obtained from the base of the skull through the vertex without intravenous contrast. COMPARISON:  03/04/2018 head CT. FINDINGS: Brain: No evidence of parenchymal hemorrhage or extra-axial fluid collection. No mass lesion, mass effect, or midline shift. No CT evidence of acute infarction. Generalized cerebral volume loss. Nonspecific mild subcortical and periventricular white matter hypodensity, most in keeping with chronic small vessel ischemic change. No ventriculomegaly. Vascular: No acute abnormality. Skull: No evidence of calvarial fracture. Sinuses/Orbits: No fluid levels. Mild frothy material layering in left maxillary sinus. Other: Tiny lateral left supraorbital scalp contusion, slightly decreased. The mastoid air cells are unopacified. IMPRESSION: 1. Tiny left supraorbital scalp contusion, decreased. No evidence of calvarial fracture. 2.  No evidence of acute intracranial abnormality. 3. Generalized cerebral volume loss and mild chronic small vessel ischemic changes in the cerebral white matter. Electronically Signed   By: Ilona Sorrel M.D.   On: 03/06/2018 13:27    Assessment & Plan:   There are no diagnoses linked to this encounter.   No orders of the defined types were placed in this  encounter.    Follow-up: No follow-ups on file.  Walker Kehr, MD

## 2018-09-03 NOTE — Assessment & Plan Note (Signed)
Doing well 

## 2018-09-12 DIAGNOSIS — F064 Anxiety disorder due to known physiological condition: Secondary | ICD-10-CM | POA: Diagnosis not present

## 2018-09-12 DIAGNOSIS — R69 Illness, unspecified: Secondary | ICD-10-CM | POA: Diagnosis not present

## 2018-09-12 DIAGNOSIS — F0632 Mood disorder due to known physiological condition with major depressive-like episode: Secondary | ICD-10-CM | POA: Diagnosis not present

## 2018-09-19 DIAGNOSIS — R69 Illness, unspecified: Secondary | ICD-10-CM | POA: Diagnosis not present

## 2018-09-19 DIAGNOSIS — F064 Anxiety disorder due to known physiological condition: Secondary | ICD-10-CM | POA: Diagnosis not present

## 2018-09-19 DIAGNOSIS — F0150 Vascular dementia without behavioral disturbance: Secondary | ICD-10-CM | POA: Diagnosis not present

## 2018-09-22 DIAGNOSIS — R69 Illness, unspecified: Secondary | ICD-10-CM | POA: Diagnosis not present

## 2018-09-22 DIAGNOSIS — F0150 Vascular dementia without behavioral disturbance: Secondary | ICD-10-CM | POA: Diagnosis not present

## 2018-09-26 DIAGNOSIS — F064 Anxiety disorder due to known physiological condition: Secondary | ICD-10-CM | POA: Diagnosis not present

## 2018-09-26 DIAGNOSIS — R69 Illness, unspecified: Secondary | ICD-10-CM | POA: Diagnosis not present

## 2018-09-26 DIAGNOSIS — F0150 Vascular dementia without behavioral disturbance: Secondary | ICD-10-CM | POA: Diagnosis not present

## 2018-10-10 DIAGNOSIS — F0150 Vascular dementia without behavioral disturbance: Secondary | ICD-10-CM | POA: Diagnosis not present

## 2018-10-10 DIAGNOSIS — R69 Illness, unspecified: Secondary | ICD-10-CM | POA: Diagnosis not present

## 2018-10-10 DIAGNOSIS — F064 Anxiety disorder due to known physiological condition: Secondary | ICD-10-CM | POA: Diagnosis not present

## 2018-10-11 ENCOUNTER — Encounter: Payer: Self-pay | Admitting: Internal Medicine

## 2018-10-11 DIAGNOSIS — N390 Urinary tract infection, site not specified: Secondary | ICD-10-CM | POA: Diagnosis not present

## 2018-10-18 ENCOUNTER — Telehealth: Payer: Self-pay | Admitting: Internal Medicine

## 2018-10-18 NOTE — Telephone Encounter (Signed)
Sharyn Lull with [facility] is calling on patient's behalf regarding medication for UTI.  Patient is still not feeling well and would like this medication as soon as possible.  The medication should be sent to  [facility], [place] [address] [phone] (Phone) [phone] (Fax)   As soon as possible.  If there are any questions, please call.

## 2018-10-18 NOTE — Telephone Encounter (Signed)
Copied from [place] [phone]. Topic: Quick Communication - See Telephone Encounter >> Oct 18, 2018  9:26 AM Bea Graff, NT wrote: CRM for notification. See Telephone encounter for: 10/18/18. Pts daughter states that Regina Williams in [place] tried to contact the office due to her mom having a UTI and needing orders for medication. She states that her mom is not feeling well due to the UTI. Please advise. [facility]'s phone #: [phone]

## 2018-10-18 NOTE — Telephone Encounter (Signed)
please advise.

## 2018-10-19 MED ORDER — CEPHALEXIN 500 MG PO CAPS: 500.0000 mg | ORAL_CAPSULE | Freq: Three times a day (TID) | ORAL | 0 refills | Status: AC

## 2018-10-19 NOTE — Telephone Encounter (Signed)
Nurse on call informed at [facility]

## 2018-10-19 NOTE — Telephone Encounter (Signed)
We normally do not prescribe antibiotic on request, and are trying to do telehealth visits, but pt has elderly, dementia and given the extreme situation with the pandemic and access to care, We will send antibiotic to the pharmacy

## 2018-10-24 DIAGNOSIS — N390 Urinary tract infection, site not specified: Secondary | ICD-10-CM | POA: Diagnosis not present

## 2018-10-24 DIAGNOSIS — I10 Essential (primary) hypertension: Secondary | ICD-10-CM | POA: Diagnosis not present

## 2018-10-24 DIAGNOSIS — F064 Anxiety disorder due to known physiological condition: Secondary | ICD-10-CM | POA: Diagnosis not present

## 2018-10-24 DIAGNOSIS — F0150 Vascular dementia without behavioral disturbance: Secondary | ICD-10-CM | POA: Diagnosis not present

## 2018-10-24 DIAGNOSIS — F0632 Mood disorder due to known physiological condition with major depressive-like episode: Secondary | ICD-10-CM | POA: Diagnosis not present

## 2018-10-24 DIAGNOSIS — E785 Hyperlipidemia, unspecified: Secondary | ICD-10-CM | POA: Diagnosis not present

## 2018-10-24 DIAGNOSIS — R69 Illness, unspecified: Secondary | ICD-10-CM | POA: Diagnosis not present

## 2018-11-01 DIAGNOSIS — F0150 Vascular dementia without behavioral disturbance: Secondary | ICD-10-CM | POA: Diagnosis not present

## 2018-11-01 DIAGNOSIS — F064 Anxiety disorder due to known physiological condition: Secondary | ICD-10-CM | POA: Diagnosis not present

## 2018-11-01 DIAGNOSIS — R69 Illness, unspecified: Secondary | ICD-10-CM | POA: Diagnosis not present

## 2018-11-07 DIAGNOSIS — R69 Illness, unspecified: Secondary | ICD-10-CM | POA: Diagnosis not present

## 2018-11-07 DIAGNOSIS — F0632 Mood disorder due to known physiological condition with major depressive-like episode: Secondary | ICD-10-CM | POA: Diagnosis not present

## 2018-11-07 DIAGNOSIS — F0150 Vascular dementia without behavioral disturbance: Secondary | ICD-10-CM | POA: Diagnosis not present

## 2018-11-09 DIAGNOSIS — R69 Illness, unspecified: Secondary | ICD-10-CM | POA: Diagnosis not present

## 2018-11-09 DIAGNOSIS — F0150 Vascular dementia without behavioral disturbance: Secondary | ICD-10-CM | POA: Diagnosis not present

## 2018-11-09 DIAGNOSIS — F064 Anxiety disorder due to known physiological condition: Secondary | ICD-10-CM | POA: Diagnosis not present

## 2018-11-21 DIAGNOSIS — F0150 Vascular dementia without behavioral disturbance: Secondary | ICD-10-CM | POA: Diagnosis not present

## 2018-11-21 DIAGNOSIS — R69 Illness, unspecified: Secondary | ICD-10-CM | POA: Diagnosis not present

## 2018-11-21 DIAGNOSIS — F0632 Mood disorder due to known physiological condition with major depressive-like episode: Secondary | ICD-10-CM | POA: Diagnosis not present

## 2018-11-22 DIAGNOSIS — F0150 Vascular dementia without behavioral disturbance: Secondary | ICD-10-CM | POA: Diagnosis not present

## 2018-11-22 DIAGNOSIS — R69 Illness, unspecified: Secondary | ICD-10-CM | POA: Diagnosis not present

## 2018-11-28 DIAGNOSIS — F064 Anxiety disorder due to known physiological condition: Secondary | ICD-10-CM | POA: Diagnosis not present

## 2018-11-28 DIAGNOSIS — R69 Illness, unspecified: Secondary | ICD-10-CM | POA: Diagnosis not present

## 2018-11-28 DIAGNOSIS — F0632 Mood disorder due to known physiological condition with major depressive-like episode: Secondary | ICD-10-CM | POA: Diagnosis not present

## 2018-12-03 DIAGNOSIS — I10 Essential (primary) hypertension: Secondary | ICD-10-CM | POA: Diagnosis not present

## 2018-12-03 DIAGNOSIS — R69 Illness, unspecified: Secondary | ICD-10-CM | POA: Diagnosis not present

## 2018-12-03 DIAGNOSIS — E785 Hyperlipidemia, unspecified: Secondary | ICD-10-CM | POA: Diagnosis not present

## 2018-12-12 DIAGNOSIS — R69 Illness, unspecified: Secondary | ICD-10-CM | POA: Diagnosis not present

## 2018-12-12 DIAGNOSIS — F064 Anxiety disorder due to known physiological condition: Secondary | ICD-10-CM | POA: Diagnosis not present

## 2018-12-12 DIAGNOSIS — F0150 Vascular dementia without behavioral disturbance: Secondary | ICD-10-CM | POA: Diagnosis not present

## 2018-12-19 DIAGNOSIS — Z03818 Encounter for observation for suspected exposure to other biological agents ruled out: Secondary | ICD-10-CM | POA: Diagnosis not present

## 2018-12-20 DIAGNOSIS — R69 Illness, unspecified: Secondary | ICD-10-CM | POA: Diagnosis not present

## 2018-12-20 DIAGNOSIS — N390 Urinary tract infection, site not specified: Secondary | ICD-10-CM | POA: Diagnosis not present

## 2018-12-20 DIAGNOSIS — F064 Anxiety disorder due to known physiological condition: Secondary | ICD-10-CM | POA: Diagnosis not present

## 2018-12-20 DIAGNOSIS — F0150 Vascular dementia without behavioral disturbance: Secondary | ICD-10-CM | POA: Diagnosis not present

## 2018-12-21 DIAGNOSIS — F0150 Vascular dementia without behavioral disturbance: Secondary | ICD-10-CM | POA: Diagnosis not present

## 2018-12-21 DIAGNOSIS — N390 Urinary tract infection, site not specified: Secondary | ICD-10-CM | POA: Diagnosis not present

## 2018-12-21 DIAGNOSIS — I10 Essential (primary) hypertension: Secondary | ICD-10-CM | POA: Diagnosis not present

## 2018-12-21 DIAGNOSIS — F0632 Mood disorder due to known physiological condition with major depressive-like episode: Secondary | ICD-10-CM | POA: Diagnosis not present

## 2018-12-21 DIAGNOSIS — R69 Illness, unspecified: Secondary | ICD-10-CM | POA: Diagnosis not present

## 2018-12-21 DIAGNOSIS — E785 Hyperlipidemia, unspecified: Secondary | ICD-10-CM | POA: Diagnosis not present

## 2018-12-23 DIAGNOSIS — R69 Illness, unspecified: Secondary | ICD-10-CM | POA: Diagnosis not present

## 2018-12-23 DIAGNOSIS — F0150 Vascular dementia without behavioral disturbance: Secondary | ICD-10-CM | POA: Diagnosis not present

## 2019-01-02 ENCOUNTER — Ambulatory Visit: Payer: Medicare HMO | Admitting: Internal Medicine

## 2019-01-02 DIAGNOSIS — K5900 Constipation, unspecified: Secondary | ICD-10-CM | POA: Diagnosis not present

## 2019-01-02 DIAGNOSIS — F064 Anxiety disorder due to known physiological condition: Secondary | ICD-10-CM | POA: Diagnosis not present

## 2019-01-02 DIAGNOSIS — R69 Illness, unspecified: Secondary | ICD-10-CM | POA: Diagnosis not present

## 2019-01-02 DIAGNOSIS — F0150 Vascular dementia without behavioral disturbance: Secondary | ICD-10-CM | POA: Diagnosis not present

## 2019-01-02 DIAGNOSIS — I10 Essential (primary) hypertension: Secondary | ICD-10-CM | POA: Diagnosis not present

## 2019-01-07 DIAGNOSIS — R69 Illness, unspecified: Secondary | ICD-10-CM | POA: Diagnosis not present

## 2019-01-07 DIAGNOSIS — F0151 Vascular dementia with behavioral disturbance: Secondary | ICD-10-CM | POA: Diagnosis not present

## 2019-01-07 DIAGNOSIS — F064 Anxiety disorder due to known physiological condition: Secondary | ICD-10-CM | POA: Diagnosis not present

## 2019-01-16 DIAGNOSIS — F0632 Mood disorder due to known physiological condition with major depressive-like episode: Secondary | ICD-10-CM | POA: Diagnosis not present

## 2019-01-16 DIAGNOSIS — F064 Anxiety disorder due to known physiological condition: Secondary | ICD-10-CM | POA: Diagnosis not present

## 2019-01-16 DIAGNOSIS — R69 Illness, unspecified: Secondary | ICD-10-CM | POA: Diagnosis not present

## 2019-01-21 DIAGNOSIS — F0151 Vascular dementia with behavioral disturbance: Secondary | ICD-10-CM | POA: Diagnosis not present

## 2019-01-21 DIAGNOSIS — F064 Anxiety disorder due to known physiological condition: Secondary | ICD-10-CM | POA: Diagnosis not present

## 2019-01-21 DIAGNOSIS — R69 Illness, unspecified: Secondary | ICD-10-CM | POA: Diagnosis not present

## 2019-01-22 DIAGNOSIS — R69 Illness, unspecified: Secondary | ICD-10-CM | POA: Diagnosis not present

## 2019-01-22 DIAGNOSIS — F0150 Vascular dementia without behavioral disturbance: Secondary | ICD-10-CM | POA: Diagnosis not present

## 2019-01-30 DIAGNOSIS — G8929 Other chronic pain: Secondary | ICD-10-CM | POA: Diagnosis not present

## 2019-01-30 DIAGNOSIS — K5900 Constipation, unspecified: Secondary | ICD-10-CM | POA: Diagnosis not present

## 2019-01-30 DIAGNOSIS — I10 Essential (primary) hypertension: Secondary | ICD-10-CM | POA: Diagnosis not present

## 2019-02-04 DIAGNOSIS — R69 Illness, unspecified: Secondary | ICD-10-CM | POA: Diagnosis not present

## 2019-02-04 DIAGNOSIS — G894 Chronic pain syndrome: Secondary | ICD-10-CM | POA: Diagnosis not present

## 2019-02-04 DIAGNOSIS — F0632 Mood disorder due to known physiological condition with major depressive-like episode: Secondary | ICD-10-CM | POA: Diagnosis not present

## 2019-02-04 DIAGNOSIS — F064 Anxiety disorder due to known physiological condition: Secondary | ICD-10-CM | POA: Diagnosis not present

## 2019-02-11 ENCOUNTER — Emergency Department (HOSPITAL_COMMUNITY): Payer: Medicare HMO

## 2019-02-11 ENCOUNTER — Emergency Department (HOSPITAL_COMMUNITY)
Admission: EM | Admit: 2019-02-11 | Discharge: 2019-02-12 | Disposition: A | Discharge status: Home / Self Care | Payer: Medicare HMO | Attending: Emergency Medicine | Admitting: Emergency Medicine

## 2019-02-11 ENCOUNTER — Encounter (HOSPITAL_COMMUNITY): Payer: Self-pay | Admitting: Emergency Medicine

## 2019-02-11 ENCOUNTER — Other Ambulatory Visit: Payer: Self-pay

## 2019-02-11 DIAGNOSIS — Z209 Contact with and (suspected) exposure to unspecified communicable disease: Secondary | ICD-10-CM | POA: Diagnosis not present

## 2019-02-11 DIAGNOSIS — S42211A Unspecified displaced fracture of surgical neck of right humerus, initial encounter for closed fracture: Secondary | ICD-10-CM | POA: Diagnosis not present

## 2019-02-11 DIAGNOSIS — Z79899 Other long term (current) drug therapy: Secondary | ICD-10-CM | POA: Diagnosis not present

## 2019-02-11 DIAGNOSIS — M25511 Pain in right shoulder: Secondary | ICD-10-CM | POA: Diagnosis not present

## 2019-02-11 DIAGNOSIS — F0390 Unspecified dementia without behavioral disturbance: Secondary | ICD-10-CM | POA: Diagnosis not present

## 2019-02-11 DIAGNOSIS — R52 Pain, unspecified: Secondary | ICD-10-CM | POA: Diagnosis not present

## 2019-02-11 DIAGNOSIS — W1830XA Fall on same level, unspecified, initial encounter: Secondary | ICD-10-CM | POA: Insufficient documentation

## 2019-02-11 DIAGNOSIS — Y999 Unspecified external cause status: Secondary | ICD-10-CM | POA: Insufficient documentation

## 2019-02-11 DIAGNOSIS — E161 Other hypoglycemia: Secondary | ICD-10-CM | POA: Diagnosis not present

## 2019-02-11 DIAGNOSIS — E162 Hypoglycemia, unspecified: Secondary | ICD-10-CM | POA: Diagnosis not present

## 2019-02-11 DIAGNOSIS — Y939 Activity, unspecified: Secondary | ICD-10-CM | POA: Diagnosis not present

## 2019-02-11 DIAGNOSIS — Y92129 Unspecified place in nursing home as the place of occurrence of the external cause: Secondary | ICD-10-CM | POA: Diagnosis not present

## 2019-02-11 DIAGNOSIS — I10 Essential (primary) hypertension: Secondary | ICD-10-CM | POA: Diagnosis not present

## 2019-02-11 DIAGNOSIS — Z853 Personal history of malignant neoplasm of breast: Secondary | ICD-10-CM | POA: Diagnosis not present

## 2019-02-11 DIAGNOSIS — S42291A Other displaced fracture of upper end of right humerus, initial encounter for closed fracture: Secondary | ICD-10-CM | POA: Insufficient documentation

## 2019-02-11 DIAGNOSIS — R69 Illness, unspecified: Secondary | ICD-10-CM | POA: Diagnosis not present

## 2019-02-11 HISTORY — DX: Unspecified dementia, unspecified severity, without behavioral disturbance, psychotic disturbance, mood disturbance, and anxiety: F03.90

## 2019-02-11 MED ORDER — HYDROCODONE-ACETAMINOPHEN 5-325 MG PO TABS
1.0000 | ORAL_TABLET | Freq: Once | ORAL | Status: AC
  Administered 2019-02-11: 1 via ORAL
  Filled 2019-02-11: qty 1

## 2019-02-11 MED ORDER — HYDROCODONE-ACETAMINOPHEN 5-325 MG PO TABS: 1.0000 | ORAL_TABLET | Freq: Four times a day (QID) | ORAL | 0 refills | Status: AC | PRN

## 2019-02-11 NOTE — Discharge Instructions (Addendum)
Will need to follow-up with the orthopedist provided.  Continue to wear the sling until follow up.

## 2019-02-11 NOTE — ED Notes (Signed)
Contact Karna Christmas, pts daughter, [phone] as needed.  [facility] direct line [phone] if needed

## 2019-02-11 NOTE — ED Triage Notes (Signed)
Pt arrives via [facility] from [facility] [place], pt had a fall 3 days ago and had confirmed R humeral fracture today via xray, sent here for ortho consult, per ems. Pt has hx of dementia. Received tylenol from facility around 9pm.

## 2019-02-11 NOTE — ED Notes (Signed)
Called ptar 

## 2019-02-12 ENCOUNTER — Telehealth: Payer: Self-pay | Admitting: Orthopaedic Surgery

## 2019-02-12 DIAGNOSIS — I10 Essential (primary) hypertension: Secondary | ICD-10-CM | POA: Diagnosis not present

## 2019-02-12 DIAGNOSIS — W19XXXA Unspecified fall, initial encounter: Secondary | ICD-10-CM | POA: Diagnosis not present

## 2019-02-12 DIAGNOSIS — R52 Pain, unspecified: Secondary | ICD-10-CM | POA: Diagnosis not present

## 2019-02-12 DIAGNOSIS — R279 Unspecified lack of coordination: Secondary | ICD-10-CM | POA: Diagnosis not present

## 2019-02-12 DIAGNOSIS — Z743 Need for continuous supervision: Secondary | ICD-10-CM | POA: Diagnosis not present

## 2019-02-12 NOTE — Telephone Encounter (Signed)
Call received from [place] at [facility] facility, [phone], inquiring about appointment if patient was seen and treated at [facility] Emergency room for problem of closed fracture of right humerus: IMPRESSION:  Comminuted fracture at the right humeral head neck junction"; states patient had fallen twice, and was transported to [facility] emergency department, as was told that there was an orthopaedist there available if needed.  Asking if patient can be scheduled for orthopaedic visit here in [place]. Awaiting Dr's review.

## 2019-02-12 NOTE — ED Notes (Signed)
PTAR at bedside to transport patient back to [facility] in [place]. D/c and transport paperwork given to ptar.

## 2019-02-13 NOTE — Telephone Encounter (Signed)
On 02/12/19 12:15pm - I called back to [facility] facility to offer appointment for virtual visit per Dr Brooke Bonito review. Left message with receptionist for Sharyn Lull, med-tech-said she was in a meeting. On 02/13/19 11:40am - Followed up with facility; reached [facility]; scheduled appointment accordingly. Aware.

## 2019-02-14 DIAGNOSIS — M79671 Pain in right foot: Secondary | ICD-10-CM | POA: Diagnosis not present

## 2019-02-16 NOTE — ED Provider Notes (Signed)
[facility] EMERGENCY DEPARTMENT Provider Note   CSN: [id number] Arrival date & time: 02/11/19  2144     History   Chief Complaint Chief Complaint  Patient presents with  . Arm Injury    humeral fracture    HPI Regina Williams is a 85 y.o. female.     HPI Patient presents to the emergency department with a humerus fracture that occurred after a fall.  The patient fell 3 days ago per EMS report.  The patient is unable to give any history due to the fact that she has dementia.  The patient had an x-ray done today that shows a humeral fracture.  Patient was sent here for evaluation of the humerus fracture.  Patient has no other injuries noted on examination. Past Medical History:  Diagnosis Date  . Anxiety   . Breast cancer ([place]) 1994   right  . Cystocele   . Dementia ([place])   . Dry eyes   . Hyperlipidemia   . Hypertension   . Internal hemorrhoids   . Liver lesion 2013   multiple  . Osteoporosis     Patient Active Problem List   Diagnosis Date Noted  . Acute confusional state 06/22/2016  . Orthostatic syncope 04/09/2015  . Allergic rhinitis 10/27/2014  . Trochanteric bursitis of right hip 06/27/2014  . Dementia ([place]) 10/02/2013  . Adjustment disorder with mixed anxiety and depressed mood 01/24/2013  . Hematoma of left thigh 12/27/2012  . Recurrent knee pain 12/26/2012  . Hematochezia 11/05/2012  . Shoulder pain, left 05/18/2012  . Well adult exam 03/22/2011  . Neoplasm of uncertain behavior of skin 09/21/2010  . CHILLS WITHOUT FEVER 09/15/2009  . Pain in limb 09/15/2008  . Peripheral vascular disease ([place]) 11/12/2007  . BRONCHITIS 11/12/2007  . ALLERGY, HX OF 11/12/2007  . Actinic keratosis 09/17/2007  . Dyslipidemia 02/23/2007  . Anxiety state 02/23/2007  . Essential hypertension 02/23/2007  . Asymptomatic stenosis of left carotid artery 02/23/2007  . Osteoporosis 02/23/2007  . ABNORMAL ELECTROCARDIOGRAM 02/23/2007  . BREAST CANCER, HX OF  02/23/2007    Past Surgical History:  Procedure Laterality Date  . ABDOMINAL HYSTERECTOMY    . CHOLECYSTECTOMY    . MASTECTOMY       OB History   No obstetric history on file.      Home Medications    Prior to Admission medications   Medication Sig Start Date End Date Taking? Authorizing Provider  acetaminophen (TYLENOL) 500 MG tablet Take 1,000 mg by mouth every 6 (six) hours as needed for headache (pain).   Yes [provider]  aspirin 81 MG tablet Take 1 tablet (81 mg total) by mouth daily. 11/16/17  Yes Plotnikov, Evie Lacks, MD  busPIRone (BUSPAR) 10 MG tablet Take 10 mg by mouth daily. For anxiety 02/08/19  Yes [provider]  busPIRone (BUSPAR) 7.5 MG tablet Take 7.5 mg by mouth at bedtime. 02/07/19  Yes [provider]  citalopram (CELEXA) 10 MG tablet Take 10 mg by mouth daily. 02/08/19  Yes [provider]  LORazepam (ATIVAN) 0.5 MG tablet Take 0.5 mg by mouth 2 (two) times a day. 02/07/19  Yes [provider]  [place] 28-10 MG CP24 TAKE ONE CAPSULE BY MOUTH ONCE DAILY Patient taking differently: Take 1 capsule by mouth daily.  07/17/17  Yes Plotnikov, Evie Lacks, MD  polyethylene glycol (MIRALAX / GLYCOLAX) 17 g packet Take 17 g by mouth daily. Mix in 4-6 oz of fluid of resident's choice  Yes [provider]  FLUoxetine (PROZAC) 10 MG tablet Take 0.5 tablets (5 mg total) by mouth daily. Patient not taking: Reported on 02/11/2019 08/01/17   Plotnikov, Evie Lacks, MD  HYDROcodone-acetaminophen (NORCO/VICODIN) 5-325 MG tablet Take 1 tablet by mouth every 6 (six) hours as needed for moderate pain. 02/11/19   Lydiana Milley, Harrell Gave, PA-C  hydrocortisone (ANUSOL-HC) 25 MG suppository Place 1 suppository (25 mg total) rectally at bedtime. Patient not taking: Reported on 02/11/2019 01/04/13   Lafayette Dragon, MD  lisinopril (PRINIVIL,ZESTRIL) 2.5 MG tablet Take 1 tablet (2.5 mg total) by mouth daily. Patient not taking: Reported on  02/11/2019 05/12/15   Plotnikov, Evie Lacks, MD  LORazepam (ATIVAN) 1 MG tablet Take 1 tablet (1 mg total) by mouth 2 (two) times daily as needed. Patient not taking: Reported on 02/11/2019 08/24/18   Plotnikov, Evie Lacks, MD  lovastatin (MEVACOR) 20 MG tablet TAKE ONE TABLET BY MOUTH ONCE DAILY AT BEDTIME Patient not taking: Reported on 02/11/2019 04/17/17   Plotnikov, Evie Lacks, MD    Family History Family History  Problem Relation Age of Onset  . Heart disease Mother 75       MI  . Colon cancer Neg Hx     Social History Social History   Tobacco Use  . Smoking status: Never Smoker  . Smokeless tobacco: Never Used  Substance Use Topics  . Alcohol use: No  . Drug use: No     Allergies   Zofran [ondansetron hcl]   Review of Systems Review of Systems All other systems negative except as documented in the HPI. All pertinent positives and negatives as reviewed in the HPI.  Physical Exam Updated Vital Signs BP 130/74   Pulse 90   Temp (!) 97.5 F (36.4 C) (Oral)   Resp 16   SpO2 99%   Physical Exam Vitals signs and nursing note reviewed.  Constitutional:      General: She is not in acute distress.    Appearance: She is well-developed.  HENT:     Head: Normocephalic and atraumatic.  Eyes:     Pupils: Pupils are equal, round, and reactive to light.  Neck:     Musculoskeletal: Normal range of motion and neck supple.  Cardiovascular:     Rate and Rhythm: Normal rate and regular rhythm.     Heart sounds: Normal heart sounds. No murmur. No friction rub. No gallop.   Pulmonary:     Effort: Pulmonary effort is normal. No respiratory distress.     Breath sounds: Normal breath sounds. No wheezing.  Musculoskeletal:     Right shoulder: She exhibits decreased range of motion, tenderness and bony tenderness.  Skin:    General: Skin is warm and dry.     Capillary Refill: Capillary refill takes less than 2 seconds.     Findings: No erythema or rash.  Neurological:     Mental  Status: She is alert and oriented to person, place, and time.     Motor: No abnormal muscle tone.     Coordination: Coordination normal.  Psychiatric:        Behavior: Behavior normal.      ED Treatments / Results  Labs (all labs ordered are listed, but only abnormal results are displayed) Labs Reviewed - No data to display  EKG None  Radiology No results found.  Procedures Procedures (including critical care time)  Medications Ordered in ED Medications  HYDROcodone-acetaminophen (NORCO/VICODIN) 5-325 MG per tablet 1 tablet (1 tablet Oral Given  02/11/19 2350)     Initial Impression / Assessment and Plan / ED Course  I have reviewed the triage vital signs and the nursing notes.  Pertinent labs & imaging results that were available during my care of the patient were reviewed by me and considered in my medical decision making (see chart for details).        Patient placed in a sling and referred to orthopedics.  Patient has been stable otherwise here in the emergency department  Final Clinical Impressions(s) / ED Diagnoses   Final diagnoses:  Other closed displaced fracture of proximal end of right humerus, initial encounter    ED Discharge Orders         Ordered    HYDROcodone-acetaminophen (NORCO/VICODIN) 5-325 MG tablet  Every 6 hours PRN     02/11/19 2350           Dalia Heading, PA-C 02/16/19 1453    Hayden Rasmussen, MD 02/16/19 [phone]

## 2019-02-17 IMAGING — CT CT CERVICAL SPINE W/O CM
4 of 7 series · 14 of 33 positions shown, 15 images · non-contrast
Comparison: Head CT 06/22/2016

CLINICAL DATA: Patient fell out of bed hitting nightstand.
Laceration above left eyebrow.

EXAM:
CT HEAD WITHOUT CONTRAST
CT CERVICAL SPINE WITHOUT CONTRAST
TECHNIQUE: Multidetector CT imaging of the head and cervical spine was
performed following the standard protocol without intravenous
contrast. Multiplanar CT image reconstructions of the cervical spine
were also generated.

[Series 6: coronal soft tissue · coronal · 0.29mm/px · 3 of 64 slices shown]
[im 16/64  bone]
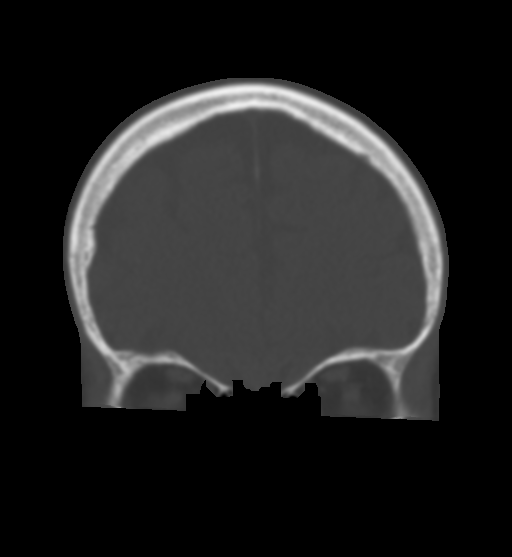
[im 32/64  bone]
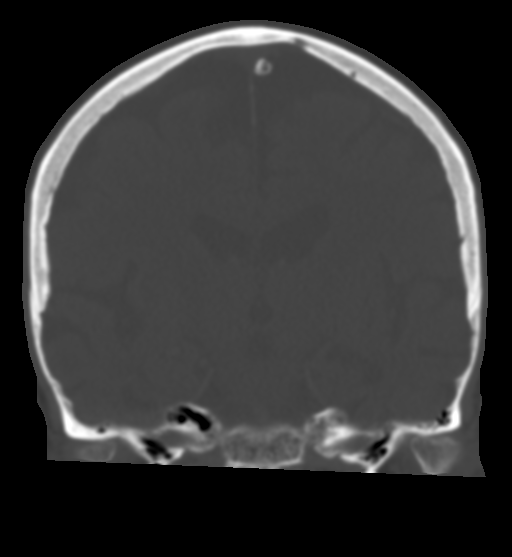
[im 48/64  bone]
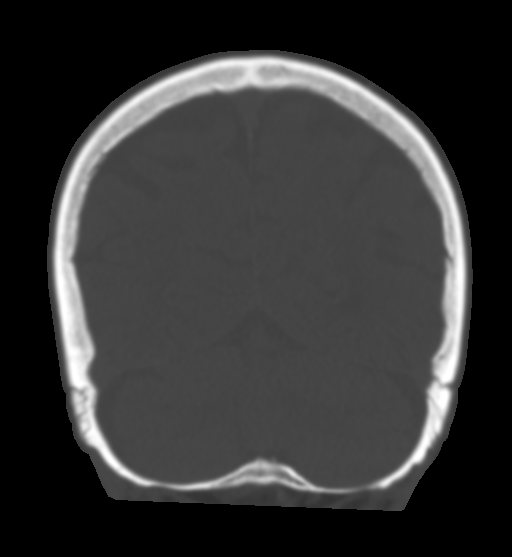

[Series 10: c spine soft · axial · 0.37mm/px · z∈[+1406,+1438]mm · 2 of 79 slices shown]
[im 16/79  soft-tissue]
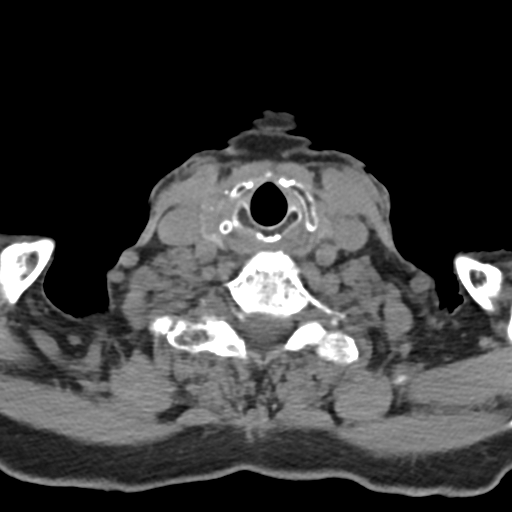
[im 32/79  soft-tissue]
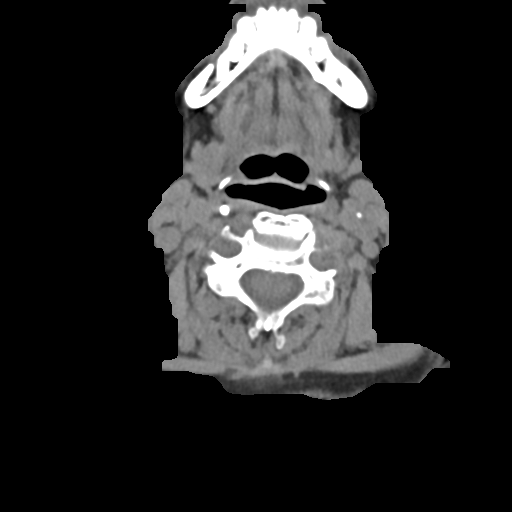

[Series 11: sagittal bone · sagittal · 0.25mm/px · 5 of 61 slices shown]
[im 11/61  bone]
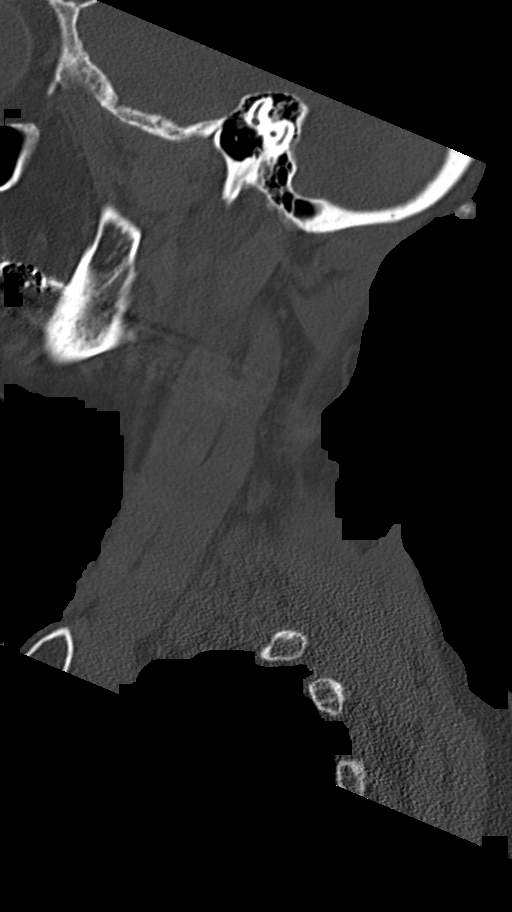
[im 21/61  bone]
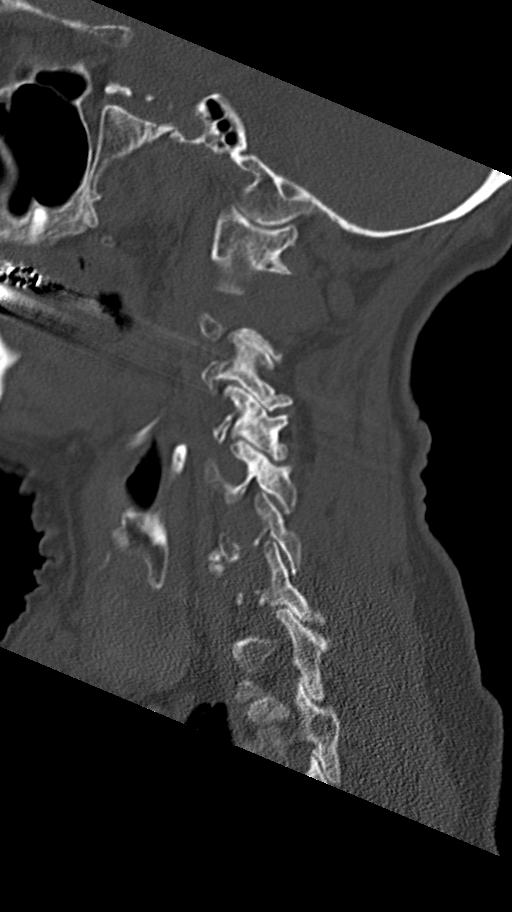
[im 31/61  bone]
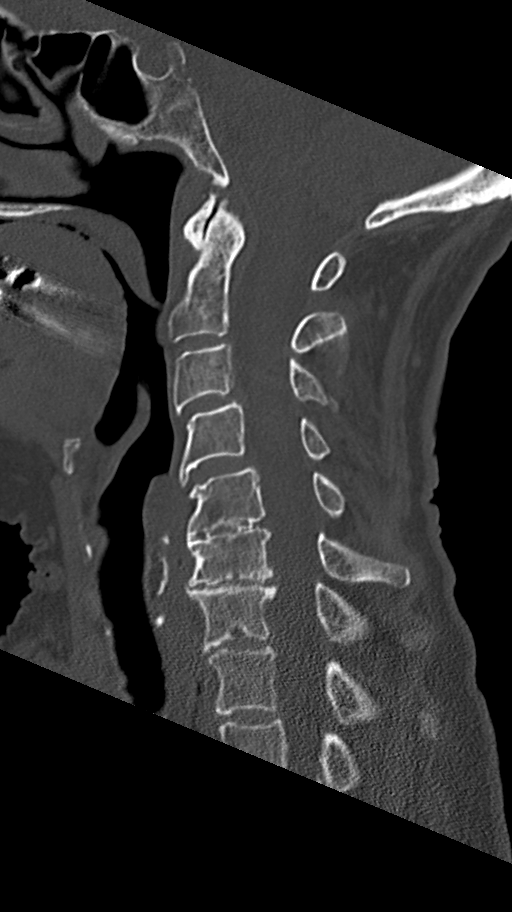
[im 41/61  bone]
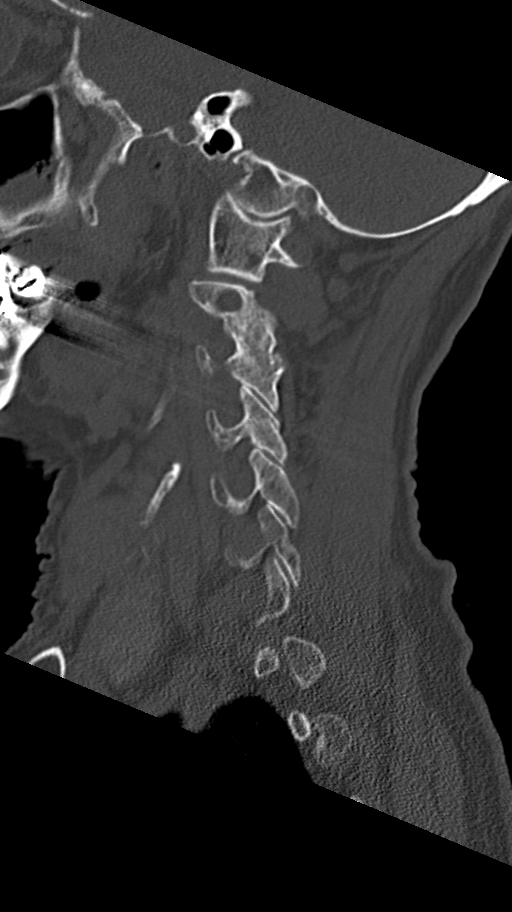
[im 51/61  bone]
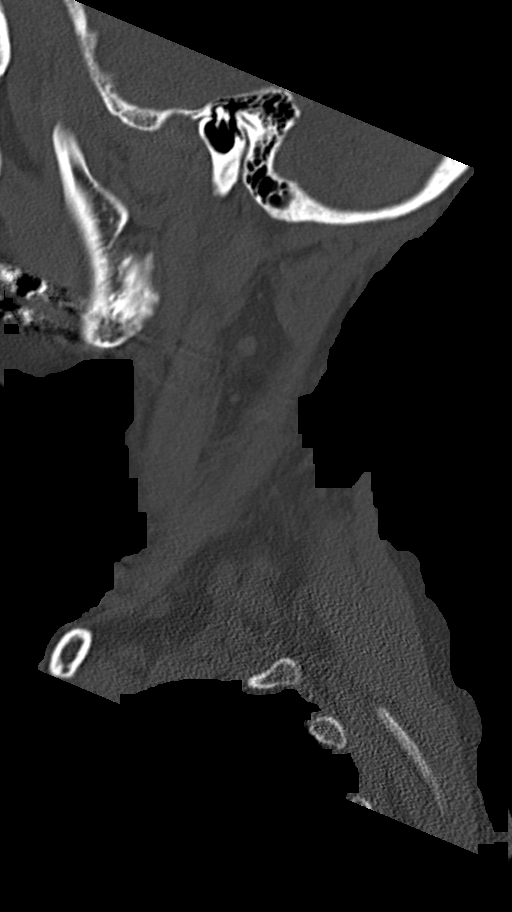

[Series 15: orthogonal bone · axial · 0.21mm/px · z∈[+1391,+1473]mm · 4 of 81 slices shown, 5 images]
[im 17/81  soft-tissue]
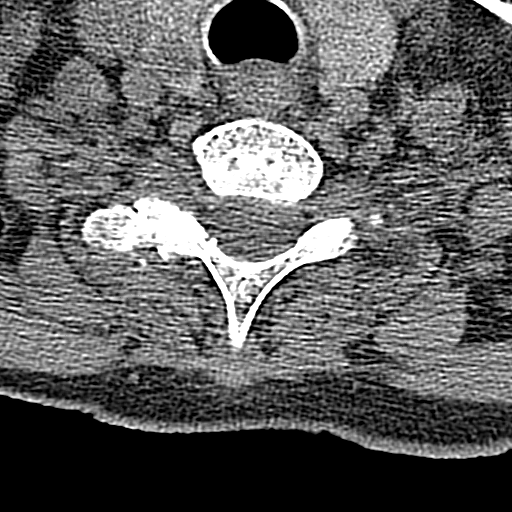
[im 17/81  bone]
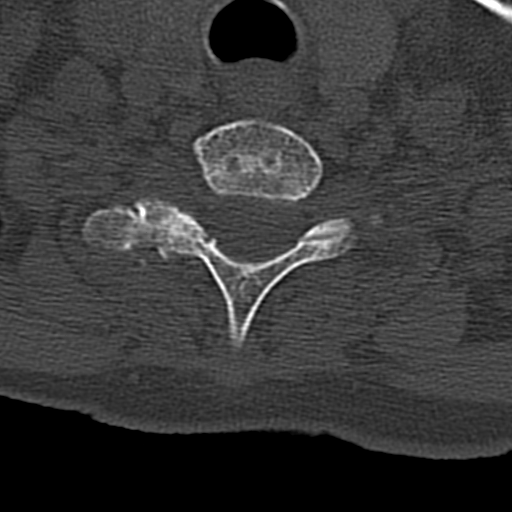
[im 33/81  bone]
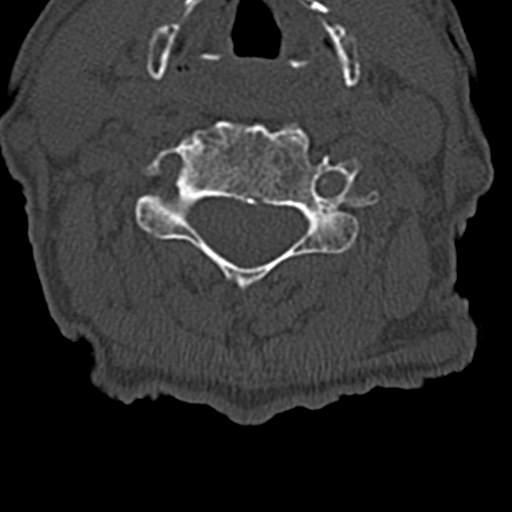
[im 49/81  bone]
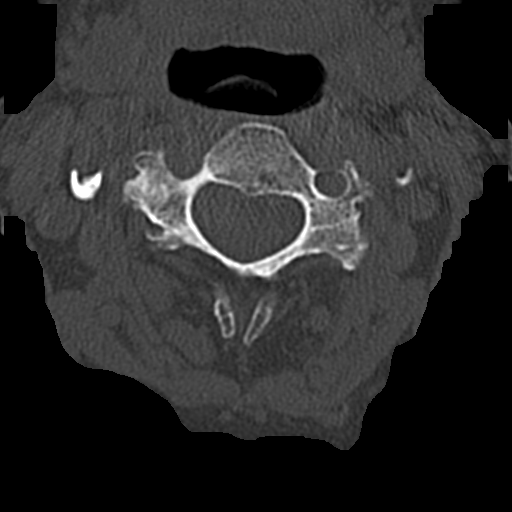
[im 65/81  bone]
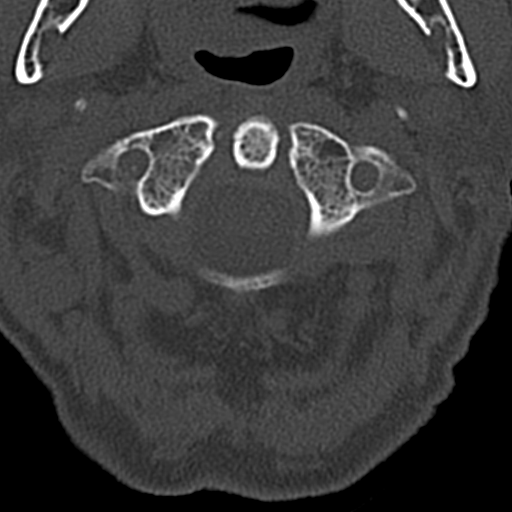

[14 of 33 positions shown; findings below may reference images not displayed]

FINDINGS: CT HEAD FINDINGS

Brain: Age related involutional changes of the brain with chronic
microvascular ischemic disease of periventricular subcortical white
matter. No acute large vascular territory infarction, intra-axial
mass nor extra-axial fluid collections. Midline fourth ventricle and
basal cisterns without effacement. The brainstem and cerebellum are
intact with cerebellar atrophy as before.

Vascular: No hyperdense vessel sign. Atherosclerosis at the skull
base as before.

Skull: Osteopenic appearance of the skull base without acute
fracture or suspicious osseous lesions.

Sinuses/Orbits: Clear paranasal sinuses and mastoids. Intact orbits
and globes.

Other: Mild left supraorbital soft tissue swelling.

CT CERVICAL SPINE FINDINGS

Alignment: Maintained cervical lordosis with minimal anterolisthesis
of C3 on C4 and C4 on C5 likely on the basis of degenerative disc
and facet arthropathy.

Skull base and vertebrae: Intact skull base. No acute cervical spine
fracture.

Soft tissues and spinal canal: No prevertebral soft tissue swelling.
No visible canal hematoma.

Disc levels: Moderate marked disc flattening at C5-6 and C6-7.
Multilevel degenerative facet arthrosis, right greater than left.

Upper chest: Scarring at the apices.

Other: Moderate dense atherosclerosis of the extracranial carotid
arteries.
IMPRESSION: 1. Left supraorbital soft tissue swelling. No underlying skull
fracture.
2. Involutional changes of the brain chronic small vessel ischemia,
likely age related. No acute intracranial abnormality.
3. No acute cervical spine fracture. Cervical spondylosis
degenerative disc disease at C5-6 and C6-7 in particular. Minimal
anterolisthesis of C3 on C4 and C4 on C5, grade 1 likely on the
basis of degenerative disc and facet arthropathy.
4. Atherosclerosis of the extracranial carotids.

## 2019-02-20 ENCOUNTER — Ambulatory Visit (INDEPENDENT_AMBULATORY_CARE_PROVIDER_SITE_OTHER): Payer: Medicare HMO | Admitting: Orthopaedic Surgery

## 2019-02-20 ENCOUNTER — Encounter: Payer: Self-pay | Admitting: Orthopaedic Surgery

## 2019-02-20 ENCOUNTER — Other Ambulatory Visit: Payer: Self-pay

## 2019-02-20 DIAGNOSIS — S42201A Unspecified fracture of upper end of right humerus, initial encounter for closed fracture: Secondary | ICD-10-CM

## 2019-02-20 NOTE — Progress Notes (Signed)
Virtual Visit via Telephone Note  I connected with@ on 02/20/19 at  9:20 AM EDT by telephone and verified that I am speaking with the correct person using two identifiers. She is a resident at a rest home, [place].  She has severe dementia.  I spoke to the charge nurse and we had a good conversation.  The family is aware of the fracture.  The patient refuses to let a sling remain in place.  She is doing her normal activity which is limited.  Location: Patient: home Provider: home   I discussed the limitations, risks, security and privacy concerns of performing an evaluation and management service by telephone and the availability of in person appointments. I also discussed with the patient that there may be a patient responsible charge related to this service. The patient expressed understanding and agreed to proceed.   History of Present Illness: She has a fracture of the proximal humerus just below the head with some anterior translation of the shaft of the humerus.  She has swelling of the arm, but more bruising of the elbow area from the fracture.  She does not seem to be in pain.  She refuses the sling.  There is no redness, no paralysis of the arm.  She uses her hand well.   Observations/Objective: Per above.  Assessment and Plan: Encounter Diagnosis  Name Primary?  . Closed fracture of proximal end of right humerus, unspecified fracture morphology, initial encounter Yes     Follow Up Instructions: Continue present nursing care. To get x-rays of the shoulder and fax report to me. Call if worse.  Telephone virtual visit in two weeks.   I discussed the assessment and treatment plan with the patient. The patient was provided an opportunity to ask questions and all were answered. The patient agreed with the plan and demonstrated an understanding of the instructions.   The patient was advised to call back or seek an in-person evaluation if the symptoms worsen or if the condition  fails to improve as anticipated.  I provided 15 minutes of non-face-to-face time during this encounter.   Sanjuana Kava, MD

## 2019-02-21 ENCOUNTER — Telehealth: Payer: Self-pay

## 2019-02-21 NOTE — Telephone Encounter (Signed)
Spoke with [facility] and got patient scheduled for virtual on 8/12. Sharyn Lull asked if you would send a order to her for the patient's Mobile x-ray for R Shoulder 2 views.  Please advise

## 2019-02-25 DIAGNOSIS — M25511 Pain in right shoulder: Secondary | ICD-10-CM | POA: Diagnosis not present

## 2019-02-26 NOTE — Telephone Encounter (Signed)
Send order.  I wrote it in the chart she provided.

## 2019-02-26 NOTE — Addendum Note (Signed)
Addended by: Derek Mound A on: 02/26/2019 01:47 PM   Modules accepted: Orders

## 2019-02-27 DIAGNOSIS — K5900 Constipation, unspecified: Secondary | ICD-10-CM | POA: Diagnosis not present

## 2019-02-27 DIAGNOSIS — Z7982 Long term (current) use of aspirin: Secondary | ICD-10-CM | POA: Diagnosis not present

## 2019-02-27 DIAGNOSIS — R296 Repeated falls: Secondary | ICD-10-CM | POA: Diagnosis not present

## 2019-03-06 ENCOUNTER — Encounter: Payer: Self-pay | Admitting: Orthopaedic Surgery

## 2019-03-06 ENCOUNTER — Other Ambulatory Visit: Payer: Self-pay

## 2019-03-06 ENCOUNTER — Ambulatory Visit (INDEPENDENT_AMBULATORY_CARE_PROVIDER_SITE_OTHER): Payer: Medicare HMO | Admitting: Orthopaedic Surgery

## 2019-03-06 DIAGNOSIS — S42201A Unspecified fracture of upper end of right humerus, initial encounter for closed fracture: Secondary | ICD-10-CM | POA: Diagnosis not present

## 2019-03-06 NOTE — Progress Notes (Signed)
Virtual Visit via Telephone Note  I connected with@ on 03/06/19 at  9:50 AM EDT by telephone and verified that I am speaking with the correct person using two identifiers.  Location: Patient: nursing home Provider: home   I discussed the limitations, risks, security and privacy concerns of performing an evaluation and management service by telephone and the availability of in person appointments. I also discussed with the patient that there may be a patient responsible charge related to this service. The patient expressed understanding and agreed to proceed.   History of Present Illness: I talked to the charge nurse, Janett Billow today.  I first called Sharyn Lull but she was off today.  The patient is doing well. She is very confused all the time.  She has refused to wear a sling.  She complains of pain very infrequently and otherwise is her normal self.  She has no new injury. She has no weakness or very obvious deformity.  I have asked that x-rays be done in two weeks and a report sent to me.  I will have another virtual visit in three weeks.  If any problem occurs or any concern, they are to call me.   Observations/Objective: Per above.  Assessment and Plan: Encounter Diagnosis  Name Primary?  . Closed fracture of proximal end of right humerus, unspecified fracture morphology, initial encounter Yes     Follow Up Instructions: Three weeks.  X-rays prior and report sent to office.   I discussed the assessment and treatment plan with the patient. The patient was provided an opportunity to ask questions and all were answered. The patient agreed with the plan and demonstrated an understanding of the instructions.   The patient was advised to call back or seek an in-person evaluation if the symptoms worsen or if the condition fails to improve as anticipated.  I provided 12 minutes of non-face-to-face time during this encounter.   Sanjuana Kava, MD

## 2019-03-13 DIAGNOSIS — R69 Illness, unspecified: Secondary | ICD-10-CM | POA: Diagnosis not present

## 2019-03-13 DIAGNOSIS — G894 Chronic pain syndrome: Secondary | ICD-10-CM | POA: Diagnosis not present

## 2019-03-13 DIAGNOSIS — M1990 Unspecified osteoarthritis, unspecified site: Secondary | ICD-10-CM | POA: Diagnosis not present

## 2019-03-13 DIAGNOSIS — F0632 Mood disorder due to known physiological condition with major depressive-like episode: Secondary | ICD-10-CM | POA: Diagnosis not present

## 2019-03-13 DIAGNOSIS — F064 Anxiety disorder due to known physiological condition: Secondary | ICD-10-CM | POA: Diagnosis not present

## 2019-03-19 DIAGNOSIS — M25511 Pain in right shoulder: Secondary | ICD-10-CM | POA: Diagnosis not present

## 2019-03-19 NOTE — Addendum Note (Signed)
Addended by: Derek Mound A on: 03/19/2019 10:31 AM   Modules accepted: Orders

## 2019-03-25 DIAGNOSIS — Z79899 Other long term (current) drug therapy: Secondary | ICD-10-CM | POA: Diagnosis not present

## 2019-03-27 ENCOUNTER — Ambulatory Visit (INDEPENDENT_AMBULATORY_CARE_PROVIDER_SITE_OTHER): Payer: Medicare HMO | Admitting: Orthopaedic Surgery

## 2019-03-27 ENCOUNTER — Other Ambulatory Visit: Payer: Self-pay

## 2019-03-27 ENCOUNTER — Encounter: Payer: Self-pay | Admitting: Orthopaedic Surgery

## 2019-03-27 DIAGNOSIS — S42201A Unspecified fracture of upper end of right humerus, initial encounter for closed fracture: Secondary | ICD-10-CM

## 2019-03-27 NOTE — Progress Notes (Signed)
Virtual Visit via Telephone Note  I connected with@ on 03/27/19 at 10:40 AM EDT by telephone and verified that I am speaking with the correct person using two identifiers.  Location: Patient: nursing home, spoke to head nurse Provider: Ledell Noss office   I discussed the limitations, risks, security and privacy concerns of performing an evaluation and management service by telephone and the availability of in person appointments. I also discussed with the patient that there may be a patient responsible charge related to this service. The patient expressed understanding and agreed to proceed.   History of Present Illness: Patient is chronically confused.  She is resident at nursing home.  I talked to Glori Luis Nurse.  X-rays had been done of the right shoulder on 03-19-2019 showing anatomic alignment and healing well.  The patient has no problems with the shoulder.    Family is aware.  I asked Janett Billow to let family know fracture is healing very well and that I will discharge her now.   Observations/Objective: Per above.  Assessment and Plan: Encounter Diagnosis  Name Primary?  . Closed fracture of proximal end of right humerus, unspecified fracture morphology, initial encounter Yes     Follow Up Instructions: Discharged.  Call as needed.   I discussed the assessment and treatment plan with the patient. The patient was provided an opportunity to ask questions and all were answered. The patient agreed with the plan and demonstrated an understanding of the instructions.   The patient was advised to call back or seek an in-person evaluation if the symptoms worsen or if the condition fails to improve as anticipated.  I provided 6 minutes of non-face-to-face time during this encounter.   Sanjuana Kava, MD

## 2019-03-28 DIAGNOSIS — I10 Essential (primary) hypertension: Secondary | ICD-10-CM | POA: Diagnosis not present

## 2019-03-28 DIAGNOSIS — K5900 Constipation, unspecified: Secondary | ICD-10-CM | POA: Diagnosis not present

## 2019-03-28 DIAGNOSIS — Z7982 Long term (current) use of aspirin: Secondary | ICD-10-CM | POA: Diagnosis not present

## 2019-03-29 DIAGNOSIS — R296 Repeated falls: Secondary | ICD-10-CM | POA: Diagnosis not present

## 2019-03-29 DIAGNOSIS — I10 Essential (primary) hypertension: Secondary | ICD-10-CM | POA: Diagnosis not present

## 2019-03-29 DIAGNOSIS — R69 Illness, unspecified: Secondary | ICD-10-CM | POA: Diagnosis not present

## 2019-04-11 DIAGNOSIS — R634 Abnormal weight loss: Secondary | ICD-10-CM | POA: Diagnosis not present

## 2019-04-11 DIAGNOSIS — G894 Chronic pain syndrome: Secondary | ICD-10-CM | POA: Diagnosis not present

## 2019-04-15 DIAGNOSIS — I10 Essential (primary) hypertension: Secondary | ICD-10-CM | POA: Diagnosis not present

## 2019-04-15 DIAGNOSIS — F0632 Mood disorder due to known physiological condition with major depressive-like episode: Secondary | ICD-10-CM | POA: Diagnosis not present

## 2019-04-15 DIAGNOSIS — G894 Chronic pain syndrome: Secondary | ICD-10-CM | POA: Diagnosis not present

## 2019-04-15 DIAGNOSIS — R69 Illness, unspecified: Secondary | ICD-10-CM | POA: Diagnosis not present

## 2019-04-25 DIAGNOSIS — R69 Illness, unspecified: Secondary | ICD-10-CM | POA: Diagnosis not present

## 2019-04-25 DIAGNOSIS — G894 Chronic pain syndrome: Secondary | ICD-10-CM | POA: Diagnosis not present

## 2019-04-25 DIAGNOSIS — I10 Essential (primary) hypertension: Secondary | ICD-10-CM | POA: Diagnosis not present

## 2019-04-30 DIAGNOSIS — R69 Illness, unspecified: Secondary | ICD-10-CM | POA: Diagnosis not present

## 2019-05-01 DIAGNOSIS — Z03818 Encounter for observation for suspected exposure to other biological agents ruled out: Secondary | ICD-10-CM | POA: Diagnosis not present

## 2019-05-08 DIAGNOSIS — Z03818 Encounter for observation for suspected exposure to other biological agents ruled out: Secondary | ICD-10-CM | POA: Diagnosis not present

## 2019-05-09 DIAGNOSIS — G894 Chronic pain syndrome: Secondary | ICD-10-CM | POA: Diagnosis not present

## 2019-05-09 DIAGNOSIS — M1990 Unspecified osteoarthritis, unspecified site: Secondary | ICD-10-CM | POA: Diagnosis not present

## 2019-05-11 DIAGNOSIS — Z79899 Other long term (current) drug therapy: Secondary | ICD-10-CM | POA: Diagnosis not present

## 2019-05-24 DIAGNOSIS — Z03818 Encounter for observation for suspected exposure to other biological agents ruled out: Secondary | ICD-10-CM | POA: Diagnosis not present

## 2019-05-29 DIAGNOSIS — Z03818 Encounter for observation for suspected exposure to other biological agents ruled out: Secondary | ICD-10-CM | POA: Diagnosis not present

## 2019-06-05 DIAGNOSIS — Z03818 Encounter for observation for suspected exposure to other biological agents ruled out: Secondary | ICD-10-CM | POA: Diagnosis not present

## 2019-07-04 DIAGNOSIS — Z03818 Encounter for observation for suspected exposure to other biological agents ruled out: Secondary | ICD-10-CM | POA: Diagnosis not present

## 2019-07-09 DIAGNOSIS — Z03818 Encounter for observation for suspected exposure to other biological agents ruled out: Secondary | ICD-10-CM | POA: Diagnosis not present

## 2019-07-09 DIAGNOSIS — Z79899 Other long term (current) drug therapy: Secondary | ICD-10-CM | POA: Diagnosis not present

## 2019-07-11 DIAGNOSIS — Z20828 Contact with and (suspected) exposure to other viral communicable diseases: Secondary | ICD-10-CM | POA: Diagnosis not present

## 2019-07-16 DIAGNOSIS — Z03818 Encounter for observation for suspected exposure to other biological agents ruled out: Secondary | ICD-10-CM | POA: Diagnosis not present

## 2019-07-23 DIAGNOSIS — Z20828 Contact with and (suspected) exposure to other viral communicable diseases: Secondary | ICD-10-CM | POA: Diagnosis not present

## 2020-04-12 ENCOUNTER — Emergency Department (HOSPITAL_COMMUNITY)
Admission: EM | Admit: 2020-04-12 | Discharge: 2020-04-12 | Disposition: A | Discharge status: Nursing Home (Includes ICF) | Attending: Emergency Medicine | Admitting: Emergency Medicine

## 2020-04-12 ENCOUNTER — Emergency Department (HOSPITAL_COMMUNITY)

## 2020-04-12 ENCOUNTER — Other Ambulatory Visit: Payer: Self-pay

## 2020-04-12 ENCOUNTER — Encounter (HOSPITAL_COMMUNITY): Payer: Self-pay | Admitting: Emergency Medicine

## 2020-04-12 DIAGNOSIS — F0390 Unspecified dementia without behavioral disturbance: Secondary | ICD-10-CM | POA: Insufficient documentation

## 2020-04-12 DIAGNOSIS — Z7982 Long term (current) use of aspirin: Secondary | ICD-10-CM | POA: Insufficient documentation

## 2020-04-12 DIAGNOSIS — Z79899 Other long term (current) drug therapy: Secondary | ICD-10-CM | POA: Insufficient documentation

## 2020-04-12 DIAGNOSIS — I10 Essential (primary) hypertension: Secondary | ICD-10-CM | POA: Insufficient documentation

## 2020-04-12 DIAGNOSIS — Z853 Personal history of malignant neoplasm of breast: Secondary | ICD-10-CM | POA: Diagnosis not present

## 2020-04-12 DIAGNOSIS — M25511 Pain in right shoulder: Secondary | ICD-10-CM | POA: Diagnosis not present

## 2020-04-12 MED ORDER — TRAMADOL HCL 50 MG PO TABS
50.0000 mg | ORAL_TABLET | Freq: Once | ORAL | Status: AC
  Administered 2020-04-12: 50 mg via ORAL
  Filled 2020-04-12: qty 1

## 2020-04-12 MED ORDER — TRAMADOL HCL 50 MG PO TABS: 50.0000 mg | ORAL_TABLET | Freq: Four times a day (QID) | ORAL | 0 refills | Status: AC | PRN

## 2020-04-12 NOTE — ED Provider Notes (Signed)
[facility] EMERGENCY DEPARTMENT Provider Note   CSN: [id number] Arrival date & time: 04/12/20  0309   Time seen 4:21 AM  History Chief Complaint  Patient presents with  . Shoulder Pain    right   Level 5 caveat for dementia  Regina Williams is a 86 y.o. female.  HPI When I approach patient she is thinking that every nurse she sees is her daughter.  She says multiple times "I want out".  When I asked her what she is wrong tonight she does not understand.  Finally she did tell me she is having pain on her right shoulder.  She states it started today or maybe yesterday but she is not sure.  She does not remember an injury.  Per her nursing home they states she has been complaining of pain for 2 weeks.   PCP Plotnikov, Evie Lacks, MD     Past Medical History:  Diagnosis Date  . Anxiety   . Breast cancer ([employer]) 1994   right  . Cystocele   . Dementia ([place])   . Dry eyes   . Hyperlipidemia   . Hypertension   . Internal hemorrhoids   . Liver lesion 2013   multiple  . Osteoporosis     Patient Active Problem List   Diagnosis Date Noted  . Acute confusional state 06/22/2016  . Orthostatic syncope 04/09/2015  . Allergic rhinitis 10/27/2014  . Trochanteric bursitis of right hip 06/27/2014  . Dementia ([place]) 10/02/2013  . Adjustment disorder with mixed anxiety and depressed mood 01/24/2013  . Hematoma of left thigh 12/27/2012  . Recurrent knee pain 12/26/2012  . Hematochezia 11/05/2012  . Shoulder pain, left 05/18/2012  . Well adult exam 03/22/2011  . Neoplasm of uncertain behavior of skin 09/21/2010  . CHILLS WITHOUT FEVER 09/15/2009  . Pain in limb 09/15/2008  . Peripheral vascular disease ([place]) 11/12/2007  . BRONCHITIS 11/12/2007  . ALLERGY, HX OF 11/12/2007  . Actinic keratosis 09/17/2007  . Dyslipidemia 02/23/2007  . Anxiety state 02/23/2007  . Essential hypertension 02/23/2007  . Asymptomatic stenosis of left carotid artery 02/23/2007  . Osteoporosis 02/23/2007    . ABNORMAL ELECTROCARDIOGRAM 02/23/2007  . BREAST CANCER, HX OF 02/23/2007    Past Surgical History:  Procedure Laterality Date  . ABDOMINAL HYSTERECTOMY    . CHOLECYSTECTOMY    . MASTECTOMY       OB History   No obstetric history on file.     Family History  Problem Relation Age of Onset  . Heart disease Mother 50       MI  . Colon cancer Neg Hx     Social History   Tobacco Use  . Smoking status: Never Smoker  . Smokeless tobacco: Never Used  Substance Use Topics  . Alcohol use: No  . Drug use: No  Lives in a nursing home  Home Medications Prior to Admission medications   Medication Sig Start Date End Date Taking? Authorizing Provider  acetaminophen (TYLENOL) 500 MG tablet Take 1,000 mg by mouth every 6 (six) hours as needed for headache (pain).    [provider]  aspirin 81 MG tablet Take 1 tablet (81 mg total) by mouth daily. 11/16/17   Plotnikov, Evie Lacks, MD  busPIRone (BUSPAR) 10 MG tablet Take 10 mg by mouth daily. For anxiety 02/08/19   [provider]  busPIRone (BUSPAR) 7.5 MG tablet Take 7.5 mg by mouth at bedtime. 02/07/19   [provider]  citalopram (CELEXA) 10 MG  tablet Take 10 mg by mouth daily. 02/08/19   [provider]  FLUoxetine (PROZAC) 10 MG tablet Take 0.5 tablets (5 mg total) by mouth daily. Patient not taking: Reported on 02/11/2019 08/01/17   Plotnikov, Evie Lacks, MD  HYDROcodone-acetaminophen (NORCO/VICODIN) 5-325 MG tablet Take 1 tablet by mouth every 6 (six) hours as needed for moderate pain. 02/11/19   Lawyer, Harrell Gave, PA-C  hydrocortisone (ANUSOL-HC) 25 MG suppository Place 1 suppository (25 mg total) rectally at bedtime. Patient not taking: Reported on 02/11/2019 01/04/13   Lafayette Dragon, MD  lisinopril (PRINIVIL,ZESTRIL) 2.5 MG tablet Take 1 tablet (2.5 mg total) by mouth daily. Patient not taking: Reported on 02/11/2019 05/12/15   Plotnikov, Evie Lacks, MD  LORazepam (ATIVAN) 0.5 MG tablet Take 0.5 mg  by mouth 2 (two) times a day. 02/07/19   [provider]  LORazepam (ATIVAN) 1 MG tablet Take 1 tablet (1 mg total) by mouth 2 (two) times daily as needed. Patient not taking: Reported on 02/11/2019 08/24/18   Plotnikov, Evie Lacks, MD  lovastatin (MEVACOR) 20 MG tablet TAKE ONE TABLET BY MOUTH ONCE DAILY AT BEDTIME Patient not taking: Reported on 02/11/2019 04/17/17   Plotnikov, Evie Lacks, MD  NAMZARIC 28-10 MG CP24 TAKE ONE CAPSULE BY MOUTH ONCE DAILY Patient taking differently: Take 1 capsule by mouth daily.  07/17/17   Plotnikov, Evie Lacks, MD  polyethylene glycol (MIRALAX / GLYCOLAX) 17 g packet Take 17 g by mouth daily. Mix in 4-6 oz of fluid of resident's choice    [provider]  traMADol (ULTRAM) 50 MG tablet Take 1 tablet (50 mg total) by mouth every 6 (six) hours as needed for moderate pain or severe pain. 04/12/20   Rolland Porter, MD    Allergies    Zofran Alvis Lemmings hcl]  Review of Systems   Review of Systems  Unable to perform ROS: Dementia    Physical Exam Updated Vital Signs BP (!) 128/56 (BP Location: Left Arm)   Pulse 83   Temp 98.6 F (37 C) (Oral)   Resp 18   Ht 5\' 5"  (1.651 m)   Wt 49.9 kg   SpO2 93%   BMI 18.30 kg/m   Physical Exam Vitals and nursing note reviewed.  Constitutional:      General: She is not in acute distress.    Appearance: Normal appearance.  HENT:     Head: Normocephalic and atraumatic.     Right Ear: External ear normal.     Left Ear: External ear normal.  Eyes:     Extraocular Movements: Extraocular movements intact.     Conjunctiva/sclera: Conjunctivae normal.  Cardiovascular:     Rate and Rhythm: Normal rate and regular rhythm.     Pulses: Normal pulses.  Pulmonary:     Effort: Pulmonary effort is normal. No respiratory distress.  Musculoskeletal:     Cervical back: Normal range of motion.     Comments: Patient appears to have swelling around her right shoulder compared to her left.  There is no bruising seen.   She does not want to move the arm due to discomfort.  She has good distal pulses.  Skin:    General: Skin is warm and dry.  Neurological:     Mental Status: She is alert. Mental status is at baseline.     Cranial Nerves: No cranial nerve deficit.  Psychiatric:        Mood and Affect: Affect is labile.     ED Results / Procedures /  Treatments   Labs (all labs ordered are listed, but only abnormal results are displayed) Labs Reviewed - No data to display  EKG None  Radiology DG Shoulder Right  Result Date: 04/12/2020 CLINICAL DATA:  Shoulder pain EXAM: RIGHT SHOULDER - 2+ VIEW COMPARISON:  02/11/2019 FINDINGS: There is again seen a comminuted fracture of the right humerus at the head neck junction. There is some callus formation notable particularly along the lateral aspect. No new fracture is seen. IMPRESSION: Nonacute fracture at the right humeral head neck junction with areas of callus formation. Electronically Signed   By: Ulyses Jarred M.D.   On: 04/12/2020 04:24    Procedures Procedures (including critical care time)  Medications Ordered in ED Medications  traMADol (ULTRAM) tablet 50 mg (50 mg Oral Given 04/12/20 0437)    ED Course  I have reviewed the triage vital signs and the nursing notes.  Pertinent labs & imaging results that were available during my care of the patient were reviewed by me and considered in my medical decision making (see chart for details).    MDM Rules/Calculators/A&P                           Review of patient's chart shows patient was seen on July 20 and had a acute fracture of the humeral head with displacement.  She was followed by Dr. Luna Glasgow orthopedist.  He last saw her on September 2 and said that it was healing well and discharged her from the practice.  Patient was given tramadol for pain and placed in a sling.  Patient is extremely painful and resists any movement.  I spoke to patient's daughter Hasina Kreager Dingess at 4:39 AM and  suggested they follow back up with Dr. Luna Glasgow.  She states she will be seeing the patient later today.    Final Clinical Impression(s) / ED Diagnoses Final diagnoses:  Acute pain of right shoulder    Rx / DC Orders ED Discharge Orders         Ordered    traMADol (ULTRAM) 50 MG tablet  Every 6 hours PRN        04/12/20 0446         Plan discharge  Rolland Porter, MD, Barbette Or, MD 04/12/20 [phone]

## 2020-04-12 NOTE — ED Triage Notes (Addendum)
Patient from [facility] for right shoulder pain. Patient denies any fall or injury. Pain has been ongoing x 2 weeks.

## 2020-04-12 NOTE — ED Notes (Signed)
[employer] truck on the way to transport pt back to facility.

## 2020-04-12 NOTE — Discharge Instructions (Addendum)
Have her wear the sling for comfort however if she does not tolerate it that is okay.  Give her the tramadol for pain.  Have her rechecked by Dr. Luna Glasgow.

## 2020-04-22 ENCOUNTER — Other Ambulatory Visit: Payer: Self-pay | Admitting: Orthopaedic Surgery

## 2020-04-22 DIAGNOSIS — S42201A Unspecified fracture of upper end of right humerus, initial encounter for closed fracture: Secondary | ICD-10-CM

## 2020-05-13 ENCOUNTER — Other Ambulatory Visit: Payer: Self-pay | Admitting: Orthopaedic Surgery

## 2020-05-13 DIAGNOSIS — S42201A Unspecified fracture of upper end of right humerus, initial encounter for closed fracture: Secondary | ICD-10-CM

## 2020-05-28 ENCOUNTER — Other Ambulatory Visit (HOSPITAL_COMMUNITY)
Admission: RE | Admit: 2020-05-28 | Discharge: 2020-05-28 | Disposition: A | Discharge status: Home / Self Care | Payer: Medicare HMO | Source: Other Acute Inpatient Hospital | Attending: Internal Medicine | Admitting: Internal Medicine

## 2020-05-28 DIAGNOSIS — N390 Urinary tract infection, site not specified: Secondary | ICD-10-CM | POA: Diagnosis present

## 2020-05-28 LAB — URINALYSIS, MICROSCOPIC (REFLEX): WBC, UA: >50 WBC/hpf (ref 0–5)

## 2020-05-28 LAB — URINALYSIS, ROUTINE W REFLEX MICROSCOPIC
Glucose, UA: NEGATIVE mg/dL
Ketones, ur: NEGATIVE mg/dL
Nitrite: NEGATIVE
Protein, ur: >300 mg/dL — AB
Specific Gravity, Urine: 1.025 (ref 1.005–1.030)
pH: 6 (ref 5.0–8.0)

## 2020-05-29 LAB — URINE CULTURE

## 2020-09-22 DEATH — deceased

## 2022-09-22 ENCOUNTER — Encounter: Payer: Self-pay | Admitting: Radiology
